# Patient Record
Sex: Male | Born: 1975 | Race: White | Hispanic: No | Marital: Married | Smoking: Current every day smoker
Health system: Southern US, Community
[De-identification: ages and names within clinical notes are randomized; demographics above are authoritative.]

## PROBLEM LIST (undated history)

## (undated) DIAGNOSIS — E7800 Pure hypercholesterolemia, unspecified: Secondary | ICD-10-CM

## (undated) DIAGNOSIS — G5603 Carpal tunnel syndrome, bilateral upper limbs: Secondary | ICD-10-CM

## (undated) DIAGNOSIS — R0681 Apnea, not elsewhere classified: Secondary | ICD-10-CM

## (undated) DIAGNOSIS — F1210 Cannabis abuse, uncomplicated: Secondary | ICD-10-CM

## (undated) DIAGNOSIS — F1110 Opioid abuse, uncomplicated: Secondary | ICD-10-CM

## (undated) DIAGNOSIS — R413 Other amnesia: Secondary | ICD-10-CM

## (undated) DIAGNOSIS — F1010 Alcohol abuse, uncomplicated: Secondary | ICD-10-CM

## (undated) DIAGNOSIS — F319 Bipolar disorder, unspecified: Secondary | ICD-10-CM

## (undated) DIAGNOSIS — I509 Heart failure, unspecified: Secondary | ICD-10-CM

## (undated) DIAGNOSIS — F1310 Sedative, hypnotic or anxiolytic abuse, uncomplicated: Secondary | ICD-10-CM

## (undated) DIAGNOSIS — I10 Essential (primary) hypertension: Secondary | ICD-10-CM

## (undated) DIAGNOSIS — J449 Chronic obstructive pulmonary disease, unspecified: Secondary | ICD-10-CM

## (undated) DIAGNOSIS — J984 Other disorders of lung: Secondary | ICD-10-CM

## (undated) DIAGNOSIS — S0990XA Unspecified injury of head, initial encounter: Secondary | ICD-10-CM

## (undated) HISTORY — PX: CARDIAC CATHETERIZATION: SHX172

## (undated) HISTORY — DX: Sedative, hypnotic or anxiolytic abuse, uncomplicated: F13.10

## (undated) HISTORY — DX: Cannabis abuse, uncomplicated: F12.10

## (undated) HISTORY — DX: Carpal tunnel syndrome, bilateral upper limbs: G56.03

## (undated) HISTORY — PX: ROOT CANAL: SHX2363

## (undated) HISTORY — PX: DENTAL SURGERY: SHX609

## (undated) HISTORY — DX: Alcohol abuse, uncomplicated: F10.10

---

## 2000-05-21 ENCOUNTER — Emergency Department (HOSPITAL_COMMUNITY): Admission: EM | Admit: 2000-05-21 | Discharge: 2000-05-21 | Payer: Self-pay | Admitting: Unknown Physician Specialty

## 2000-05-25 ENCOUNTER — Emergency Department (HOSPITAL_COMMUNITY): Admission: EM | Admit: 2000-05-25 | Discharge: 2000-05-25 | Payer: Self-pay | Admitting: Internal Medicine

## 2000-05-28 ENCOUNTER — Emergency Department (HOSPITAL_COMMUNITY): Admission: EM | Admit: 2000-05-28 | Discharge: 2000-05-28 | Payer: Self-pay | Admitting: Emergency Medicine

## 2000-06-20 ENCOUNTER — Emergency Department (HOSPITAL_COMMUNITY): Admission: EM | Admit: 2000-06-20 | Discharge: 2000-06-20 | Payer: Self-pay | Admitting: Emergency Medicine

## 2000-06-28 ENCOUNTER — Emergency Department (HOSPITAL_COMMUNITY): Admission: EM | Admit: 2000-06-28 | Discharge: 2000-06-28 | Payer: Self-pay | Admitting: *Deleted

## 2002-02-16 ENCOUNTER — Emergency Department (HOSPITAL_COMMUNITY): Admission: EM | Admit: 2002-02-16 | Discharge: 2002-02-16 | Payer: Self-pay | Admitting: Emergency Medicine

## 2002-02-17 ENCOUNTER — Encounter: Payer: Self-pay | Admitting: Emergency Medicine

## 2003-12-07 ENCOUNTER — Emergency Department (HOSPITAL_COMMUNITY): Admission: EM | Admit: 2003-12-07 | Discharge: 2003-12-07 | Payer: Self-pay | Admitting: Emergency Medicine

## 2004-03-21 ENCOUNTER — Ambulatory Visit: Payer: Self-pay | Admitting: Internal Medicine

## 2004-03-21 ENCOUNTER — Ambulatory Visit (HOSPITAL_BASED_OUTPATIENT_CLINIC_OR_DEPARTMENT_OTHER): Admission: RE | Admit: 2004-03-21 | Discharge: 2004-03-21 | Payer: Self-pay | Admitting: Emergency Medicine

## 2005-09-02 ENCOUNTER — Emergency Department (HOSPITAL_COMMUNITY): Admission: EM | Admit: 2005-09-02 | Discharge: 2005-09-02 | Payer: Self-pay | Admitting: Emergency Medicine

## 2006-04-29 ENCOUNTER — Emergency Department (HOSPITAL_COMMUNITY): Admission: EM | Admit: 2006-04-29 | Discharge: 2006-04-29 | Payer: Self-pay | Admitting: Emergency Medicine

## 2006-11-30 ENCOUNTER — Emergency Department (HOSPITAL_COMMUNITY): Admission: EM | Admit: 2006-11-30 | Discharge: 2006-12-01 | Payer: Self-pay | Admitting: Emergency Medicine

## 2006-12-07 ENCOUNTER — Emergency Department (HOSPITAL_COMMUNITY): Admission: EM | Admit: 2006-12-07 | Discharge: 2006-12-08 | Payer: Self-pay | Admitting: Emergency Medicine

## 2007-03-19 HISTORY — PX: LUNG BIOPSY: SHX232

## 2008-01-18 ENCOUNTER — Emergency Department (HOSPITAL_BASED_OUTPATIENT_CLINIC_OR_DEPARTMENT_OTHER): Admission: EM | Admit: 2008-01-18 | Discharge: 2008-01-18 | Payer: Self-pay | Admitting: Emergency Medicine

## 2008-08-26 ENCOUNTER — Emergency Department (HOSPITAL_BASED_OUTPATIENT_CLINIC_OR_DEPARTMENT_OTHER): Admission: EM | Admit: 2008-08-26 | Discharge: 2008-08-26 | Payer: Self-pay | Admitting: Emergency Medicine

## 2008-08-28 ENCOUNTER — Emergency Department (HOSPITAL_BASED_OUTPATIENT_CLINIC_OR_DEPARTMENT_OTHER): Admission: EM | Admit: 2008-08-28 | Discharge: 2008-08-28 | Payer: Self-pay | Admitting: Emergency Medicine

## 2008-09-26 ENCOUNTER — Emergency Department (HOSPITAL_BASED_OUTPATIENT_CLINIC_OR_DEPARTMENT_OTHER): Admission: EM | Admit: 2008-09-26 | Discharge: 2008-09-26 | Payer: Self-pay | Admitting: Emergency Medicine

## 2008-09-28 ENCOUNTER — Emergency Department (HOSPITAL_BASED_OUTPATIENT_CLINIC_OR_DEPARTMENT_OTHER): Admission: EM | Admit: 2008-09-28 | Discharge: 2008-09-28 | Payer: Self-pay | Admitting: Emergency Medicine

## 2008-09-28 ENCOUNTER — Ambulatory Visit: Payer: Self-pay | Admitting: Diagnostic Radiology

## 2008-10-10 ENCOUNTER — Emergency Department (HOSPITAL_BASED_OUTPATIENT_CLINIC_OR_DEPARTMENT_OTHER): Admission: EM | Admit: 2008-10-10 | Discharge: 2008-10-10 | Payer: Self-pay | Admitting: Emergency Medicine

## 2008-10-31 IMAGING — CR DG CHEST 2V
2 series · 2 of 2 positions shown · non-contrast
Comparison: 04/29/06.

CLINICAL DATA: Chest pain.  Sleep apnea.  
 CHEST - 2 VIEW:

[w chest pa]
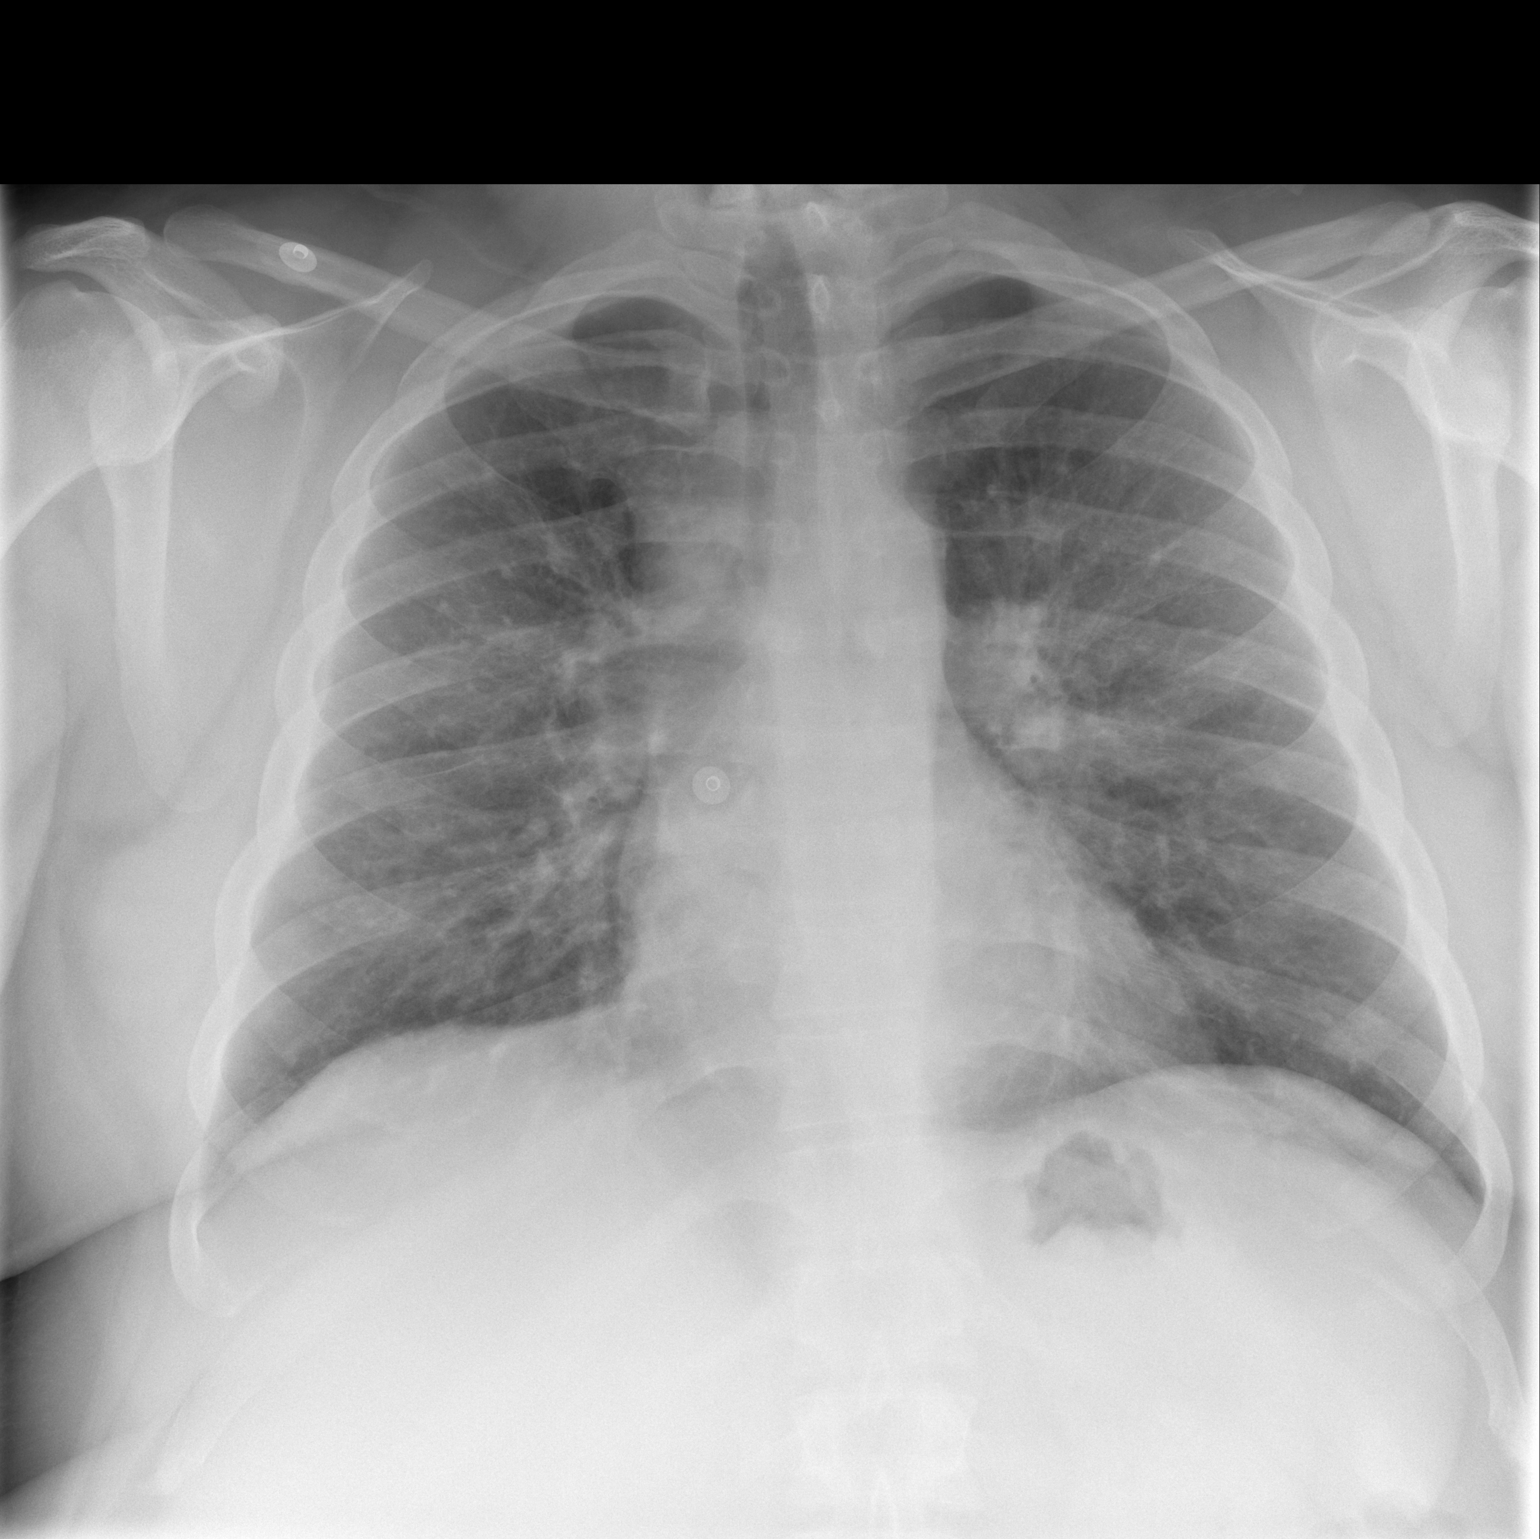

[w chest lat]
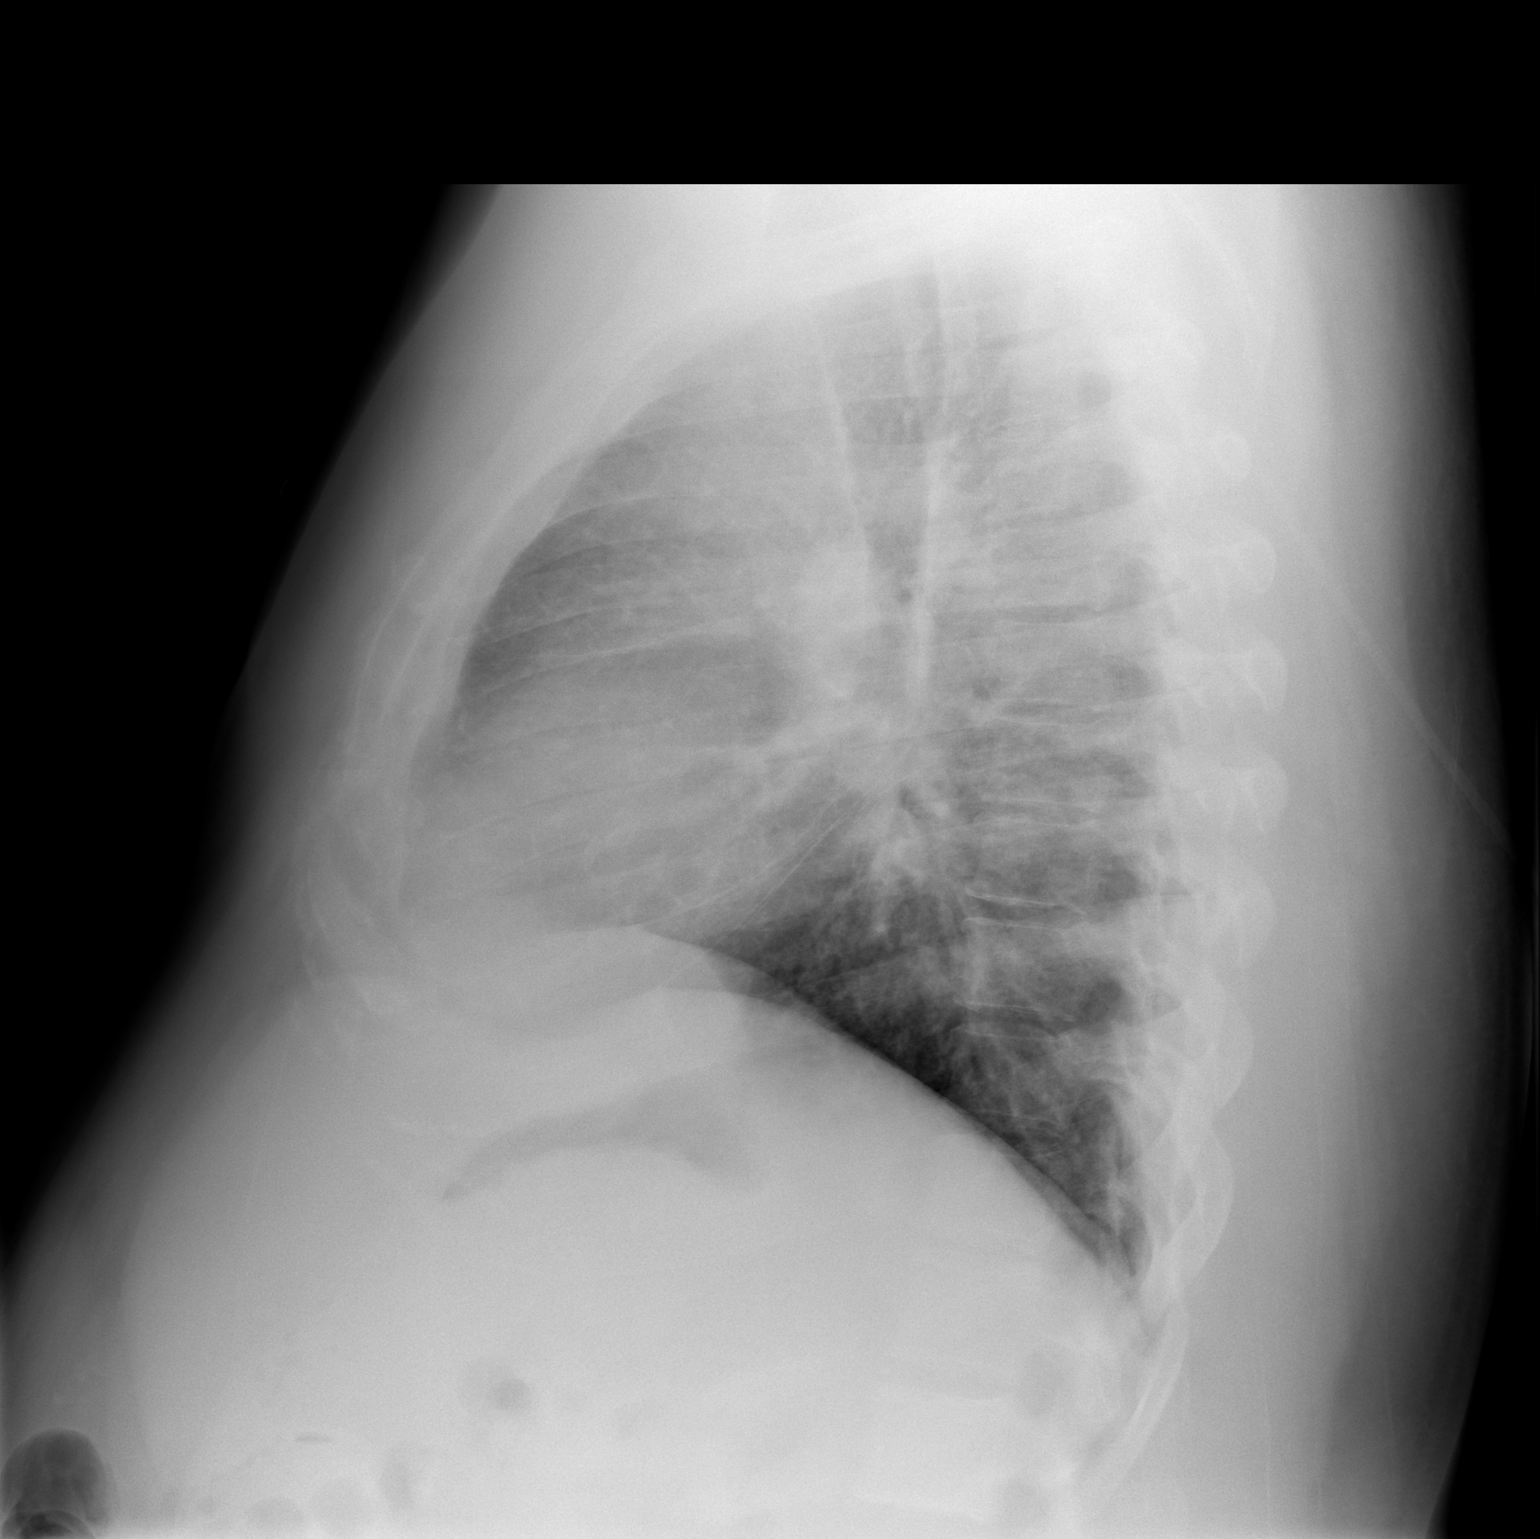

[2 of 2 positions shown; findings below may reference images not displayed]

FINDINGS: Chronic central peribronchial thickening and interstitial prominence is noted.  There is no evidence of acute infiltrate or pleural effusion.  Heart size and mediastinal contours are stable and within normal limits.
IMPRESSION: Stable chest.  No active disease.

## 2008-12-03 ENCOUNTER — Ambulatory Visit: Payer: Self-pay | Admitting: Diagnostic Radiology

## 2008-12-03 ENCOUNTER — Emergency Department (HOSPITAL_BASED_OUTPATIENT_CLINIC_OR_DEPARTMENT_OTHER): Admission: EM | Admit: 2008-12-03 | Discharge: 2008-12-04 | Payer: Self-pay | Admitting: Emergency Medicine

## 2008-12-04 ENCOUNTER — Ambulatory Visit: Payer: Self-pay | Admitting: Diagnostic Radiology

## 2008-12-27 ENCOUNTER — Emergency Department (HOSPITAL_BASED_OUTPATIENT_CLINIC_OR_DEPARTMENT_OTHER): Admission: EM | Admit: 2008-12-27 | Discharge: 2008-12-27 | Payer: Self-pay | Admitting: Emergency Medicine

## 2008-12-27 ENCOUNTER — Ambulatory Visit: Payer: Self-pay | Admitting: Diagnostic Radiology

## 2009-03-18 DIAGNOSIS — G5603 Carpal tunnel syndrome, bilateral upper limbs: Secondary | ICD-10-CM

## 2009-03-18 HISTORY — DX: Carpal tunnel syndrome, bilateral upper limbs: G56.03

## 2009-12-13 ENCOUNTER — Ambulatory Visit: Payer: Self-pay | Admitting: Radiology

## 2009-12-13 ENCOUNTER — Emergency Department (HOSPITAL_BASED_OUTPATIENT_CLINIC_OR_DEPARTMENT_OTHER): Admission: EM | Admit: 2009-12-13 | Discharge: 2009-12-13 | Payer: Self-pay | Admitting: Emergency Medicine

## 2009-12-30 ENCOUNTER — Encounter: Admission: RE | Admit: 2009-12-30 | Discharge: 2009-12-30 | Payer: Self-pay | Admitting: Orthopaedic Surgery

## 2010-04-25 ENCOUNTER — Emergency Department (HOSPITAL_BASED_OUTPATIENT_CLINIC_OR_DEPARTMENT_OTHER)
Admission: EM | Admit: 2010-04-25 | Discharge: 2010-04-25 | Disposition: A | Discharge status: Home / Self Care | Payer: Medicare Other | Attending: Emergency Medicine | Admitting: Emergency Medicine

## 2010-04-25 DIAGNOSIS — F17200 Nicotine dependence, unspecified, uncomplicated: Secondary | ICD-10-CM | POA: Insufficient documentation

## 2010-04-25 DIAGNOSIS — G8929 Other chronic pain: Secondary | ICD-10-CM | POA: Insufficient documentation

## 2010-04-25 DIAGNOSIS — I10 Essential (primary) hypertension: Secondary | ICD-10-CM | POA: Insufficient documentation

## 2010-04-25 DIAGNOSIS — E119 Type 2 diabetes mellitus without complications: Secondary | ICD-10-CM | POA: Insufficient documentation

## 2010-04-25 DIAGNOSIS — M549 Dorsalgia, unspecified: Secondary | ICD-10-CM | POA: Insufficient documentation

## 2010-04-25 DIAGNOSIS — F319 Bipolar disorder, unspecified: Secondary | ICD-10-CM | POA: Insufficient documentation

## 2010-04-25 DIAGNOSIS — J4489 Other specified chronic obstructive pulmonary disease: Secondary | ICD-10-CM | POA: Insufficient documentation

## 2010-04-25 DIAGNOSIS — J449 Chronic obstructive pulmonary disease, unspecified: Secondary | ICD-10-CM | POA: Insufficient documentation

## 2010-04-25 DIAGNOSIS — Z79899 Other long term (current) drug therapy: Secondary | ICD-10-CM | POA: Insufficient documentation

## 2010-06-06 ENCOUNTER — Ambulatory Visit (INDEPENDENT_AMBULATORY_CARE_PROVIDER_SITE_OTHER): Payer: Medicare Other | Admitting: Licensed Clinical Social Worker

## 2010-06-06 DIAGNOSIS — F329 Major depressive disorder, single episode, unspecified: Secondary | ICD-10-CM

## 2010-06-20 ENCOUNTER — Encounter (HOSPITAL_COMMUNITY): Payer: Medicare Other | Admitting: Licensed Clinical Social Worker

## 2010-06-21 LAB — GLUCOSE, CAPILLARY
Glucose-Capillary: 269 mg/dL — ABNORMAL HIGH (ref 70–99)
Glucose-Capillary: 476 mg/dL — ABNORMAL HIGH (ref 70–99)

## 2010-06-21 LAB — POCT I-STAT, CHEM 8
Hemoglobin: 17 g/dL (ref 13.0–17.0)
Sodium: 131 mEq/L — ABNORMAL LOW (ref 135–145)

## 2010-06-21 LAB — POCT TOXICOLOGY PANEL: Tetrahydrocannabinol: POSITIVE

## 2010-06-21 LAB — COMPREHENSIVE METABOLIC PANEL
AST: 40 U/L — ABNORMAL HIGH (ref 0–37)
Alkaline Phosphatase: 111 U/L (ref 39–117)
BUN: 15 mg/dL (ref 6–23)
Chloride: 99 mEq/L (ref 96–112)
GFR calc non Af Amer: >60 mL/min (ref >60)
Potassium: 4.5 mEq/L (ref 3.5–5.1)
Sodium: 135 mEq/L (ref 135–145)
Total Bilirubin: 0.6 mg/dL (ref 0.3–1.2)

## 2010-06-21 LAB — POCT I-STAT 3, ART BLOOD GAS (G3+)
Acid-base deficit: 3 mmol/L — ABNORMAL HIGH (ref 0.0–2.0)
O2 Saturation: 94 %
Patient temperature: 97.5
TCO2: 22 mmol/L (ref 0–100)
pCO2 arterial: 33.7 mmHg — ABNORMAL LOW (ref 35.0–45.0)
pH, Arterial: 7.405 (ref 7.350–7.450)

## 2010-06-21 LAB — URINALYSIS, ROUTINE W REFLEX MICROSCOPIC
Bilirubin Urine: NEGATIVE
Hgb urine dipstick: NEGATIVE

## 2010-06-21 LAB — DIFFERENTIAL
Basophils Absolute: 0.1 10*3/uL (ref 0.0–0.1)
Basophils Relative: 1 % (ref 0–1)
Eosinophils Relative: 2 % (ref 0–5)
Lymphocytes Relative: 32 % (ref 12–46)
Monocytes Absolute: 0.5 10*3/uL (ref 0.1–1.0)
Monocytes Relative: 5 % (ref 3–12)
Neutro Abs: 6.1 10*3/uL (ref 1.7–7.7)
Neutrophils Relative %: 61 % (ref 43–77)

## 2010-06-21 LAB — URINE MICROSCOPIC-ADD ON

## 2010-06-22 LAB — COMPREHENSIVE METABOLIC PANEL
AST: 27 U/L (ref 0–37)
Albumin: 4 g/dL (ref 3.5–5.2)
Calcium: 9.9 mg/dL (ref 8.4–10.5)
Creatinine, Ser: 0.8 mg/dL (ref 0.4–1.5)
GFR calc Af Amer: >60 mL/min (ref >60)
GFR calc non Af Amer: >60 mL/min (ref >60)
Total Protein: 7.6 g/dL (ref 6.0–8.3)

## 2010-06-22 LAB — CBC
HCT: 45.9 % (ref 39.0–52.0)
Hemoglobin: 16 g/dL (ref 13.0–17.0)
MCHC: 34.9 g/dL (ref 30.0–36.0)
MCV: 87.5 fL (ref 78.0–100.0)
Platelets: 246 10*3/uL (ref 150–400)
RBC: 5.25 MIL/uL (ref 4.22–5.81)
RDW: 12.3 % (ref 11.5–15.5)
WBC: 9.2 10*3/uL (ref 4.0–10.5)

## 2010-06-22 LAB — URINALYSIS, ROUTINE W REFLEX MICROSCOPIC
Bilirubin Urine: NEGATIVE
Hgb urine dipstick: NEGATIVE
Leukocytes, UA: NEGATIVE
Protein, ur: NEGATIVE mg/dL
Specific Gravity, Urine: 1.03 (ref 1.005–1.030)
Urobilinogen, UA: 0.2 mg/dL (ref 0.0–1.0)
pH: 6 (ref 5.0–8.0)

## 2010-06-22 LAB — DIFFERENTIAL
Basophils Absolute: 0.1 10*3/uL (ref 0.0–0.1)
Eosinophils Absolute: 0.1 10*3/uL (ref 0.0–0.7)
Eosinophils Relative: 1 % (ref 0–5)
Neutrophils Relative %: 69 % (ref 43–77)

## 2010-06-22 LAB — GLUCOSE, CAPILLARY
Glucose-Capillary: 278 mg/dL — ABNORMAL HIGH (ref 70–99)
Glucose-Capillary: >600 mg/dL (ref 70–99)

## 2010-06-22 LAB — URINE CULTURE

## 2010-06-22 LAB — POCT TOXICOLOGY PANEL

## 2010-06-22 LAB — ETHANOL: Alcohol, Ethyl (B): <5 mg/dL (ref 0–10)

## 2010-06-22 LAB — URINE MICROSCOPIC-ADD ON

## 2010-06-22 LAB — PROTIME-INR: Prothrombin Time: 12.2 seconds (ref 11.6–15.2)

## 2010-08-03 NOTE — Procedures (Signed)
NAME:  ESTILL, LLERENA NO.:  [id number]   MEDICAL RECORD NO.:  [id number]          PATIENT TYPE:  OUT   LOCATION:  SLEEP CENTER                 FACILITY:  [facility]   PHYSICIAN:  Clinton D. Maple Hudson, M.D. DATE OF BIRTH:  June 21, 1975   DATE OF STUDY:                              NOCTURNAL POLYSOMNOGRAM   REFERRING PHYSICIAN:  Brett Canales A. Cleta Alberts, M.D.   INDICATION FOR STUDY AND HISTORY:  Hypersomnia with sleep apnea.   Epworth sleepiness score 20/24, BMI 42.3, weight 264 pounds.   SLEEP ARCHITECTURE:  Total sleep time 447 minutes with sleep efficiency of  95%.  Stage I was 3%; stage II, 67%; Stages III and IV 4%.  REM was 26% of  total sleep time.  Latency to sleep onset 6 minutes, latency to REM 95  minutes.  Awake after sleep onset 17 minutes.  Arousal index 44.  No sleep  medications were taken.   RESPIRATORY DATA:  Split study protocol.  Respiratory disturbance index  (RDI) 120.5 obstructive events per hour indicating very severe obstructive  sleep apnea/hypopnea syndrome before CPAP.  This included 188 obstructive  apneas, 1 central apnea, 37 mixed apneas, and 35 hypopneas before CPAP.  She  slept only supine.  REM RDI 25.3 per hour.  CPAP was then titrated to 13 CWP  with only fair control, RDI 16.9.  Subsequently, the technician switched to  BIPAP with BiPAP inspiratory pressure of 20 CWP/expiratory pressure 14 CWP.  RDI was 0 per hour, and snoring was prevented.  The patient seemed to  tolerate BiPAP better than CPAP.  A medium Respironics Comfort full-face  mask was used with heated humidifier.   OXYGEN DATA:  Very loud snoring with oxygen desaturation to a nadir of 50%  before CPAP.  After CPAP titration, oxygen saturation was held around 90 to  95% on room air.   CARDIAC DATA:  Sinus arrhythmia reflecting vagal stimulation during apnea  events.  Rare PVCs.   MOVEMENT AND PARASOMNIA:  A total of 94 limb jerks were recorded of which 13  were associated with  arousal or awakening for a periodic limb movement with  arousal index of 1.7 per hour which is not likely to be significant.   IMPRESSION AND RECOMMENDATIONS:  1.  Very severe obstructive sleep apnea/hypopnea syndrome, RDI 120.5 per      hour with severe oxygen desaturation to a nadir of 50%.  2.  Successful control using BiPAP inspiratory 20 CWP/expiratory 14 CWP wit      a medium Respironics Comfort full-face mask and heated humidifier.  3.  Mild cardiac sinus arrhythmia and very mild periodic limb movement are      likely to improve as the apnea is corrected.   Clinton D. Maple Hudson, M.D.  Diplomate, Biomedical engineer of Sleep Medicine                                                           Clinton D. Maple Hudson, M.D.  Diplomate, [employer] Board   CDY/MEDQ  D:  03/25/2004 10:48:14  T:  03/25/2004 14:38:27  Job:  [id number]   cc:   Brett Canales A. Cleta Alberts, M.D.  [address]  [place]  [place] [address]  Fax: [phone]

## 2010-08-27 ENCOUNTER — Ambulatory Visit (INDEPENDENT_AMBULATORY_CARE_PROVIDER_SITE_OTHER): Payer: Medicare Other | Admitting: Physician Assistant

## 2010-08-27 DIAGNOSIS — F6381 Intermittent explosive disorder: Secondary | ICD-10-CM

## 2010-08-27 DIAGNOSIS — F39 Unspecified mood [affective] disorder: Secondary | ICD-10-CM

## 2010-09-17 ENCOUNTER — Encounter (INDEPENDENT_AMBULATORY_CARE_PROVIDER_SITE_OTHER): Payer: Medicare Other | Admitting: Physician Assistant

## 2010-09-17 DIAGNOSIS — F309 Manic episode, unspecified: Secondary | ICD-10-CM

## 2010-09-17 DIAGNOSIS — F6381 Intermittent explosive disorder: Secondary | ICD-10-CM

## 2010-09-26 ENCOUNTER — Encounter (HOSPITAL_COMMUNITY): Payer: Medicare Other | Admitting: Physician Assistant

## 2010-10-01 ENCOUNTER — Encounter (HOSPITAL_COMMUNITY): Payer: Medicare Other | Admitting: Physician Assistant

## 2010-10-02 ENCOUNTER — Ambulatory Visit (HOSPITAL_COMMUNITY): Payer: Medicare Other | Admitting: Licensed Clinical Social Worker

## 2010-10-11 ENCOUNTER — Ambulatory Visit (HOSPITAL_COMMUNITY): Payer: Medicare Other | Admitting: Psychiatry

## 2010-10-23 ENCOUNTER — Ambulatory Visit (INDEPENDENT_AMBULATORY_CARE_PROVIDER_SITE_OTHER): Payer: Medicare Other | Admitting: Licensed Clinical Social Worker

## 2010-10-23 DIAGNOSIS — F331 Major depressive disorder, recurrent, moderate: Secondary | ICD-10-CM

## 2010-11-03 IMAGING — CR DG CHEST 1V PORT
1 series · 1 of 1 positions shown · non-contrast
Comparison: Chest CTs and radiographs 12/08/2006 and earlier.

CLINICAL DATA: 33-year-old male status post fall.  Shortness of
breath.

PORTABLE CHEST - 1 VIEW

[view not recorded]
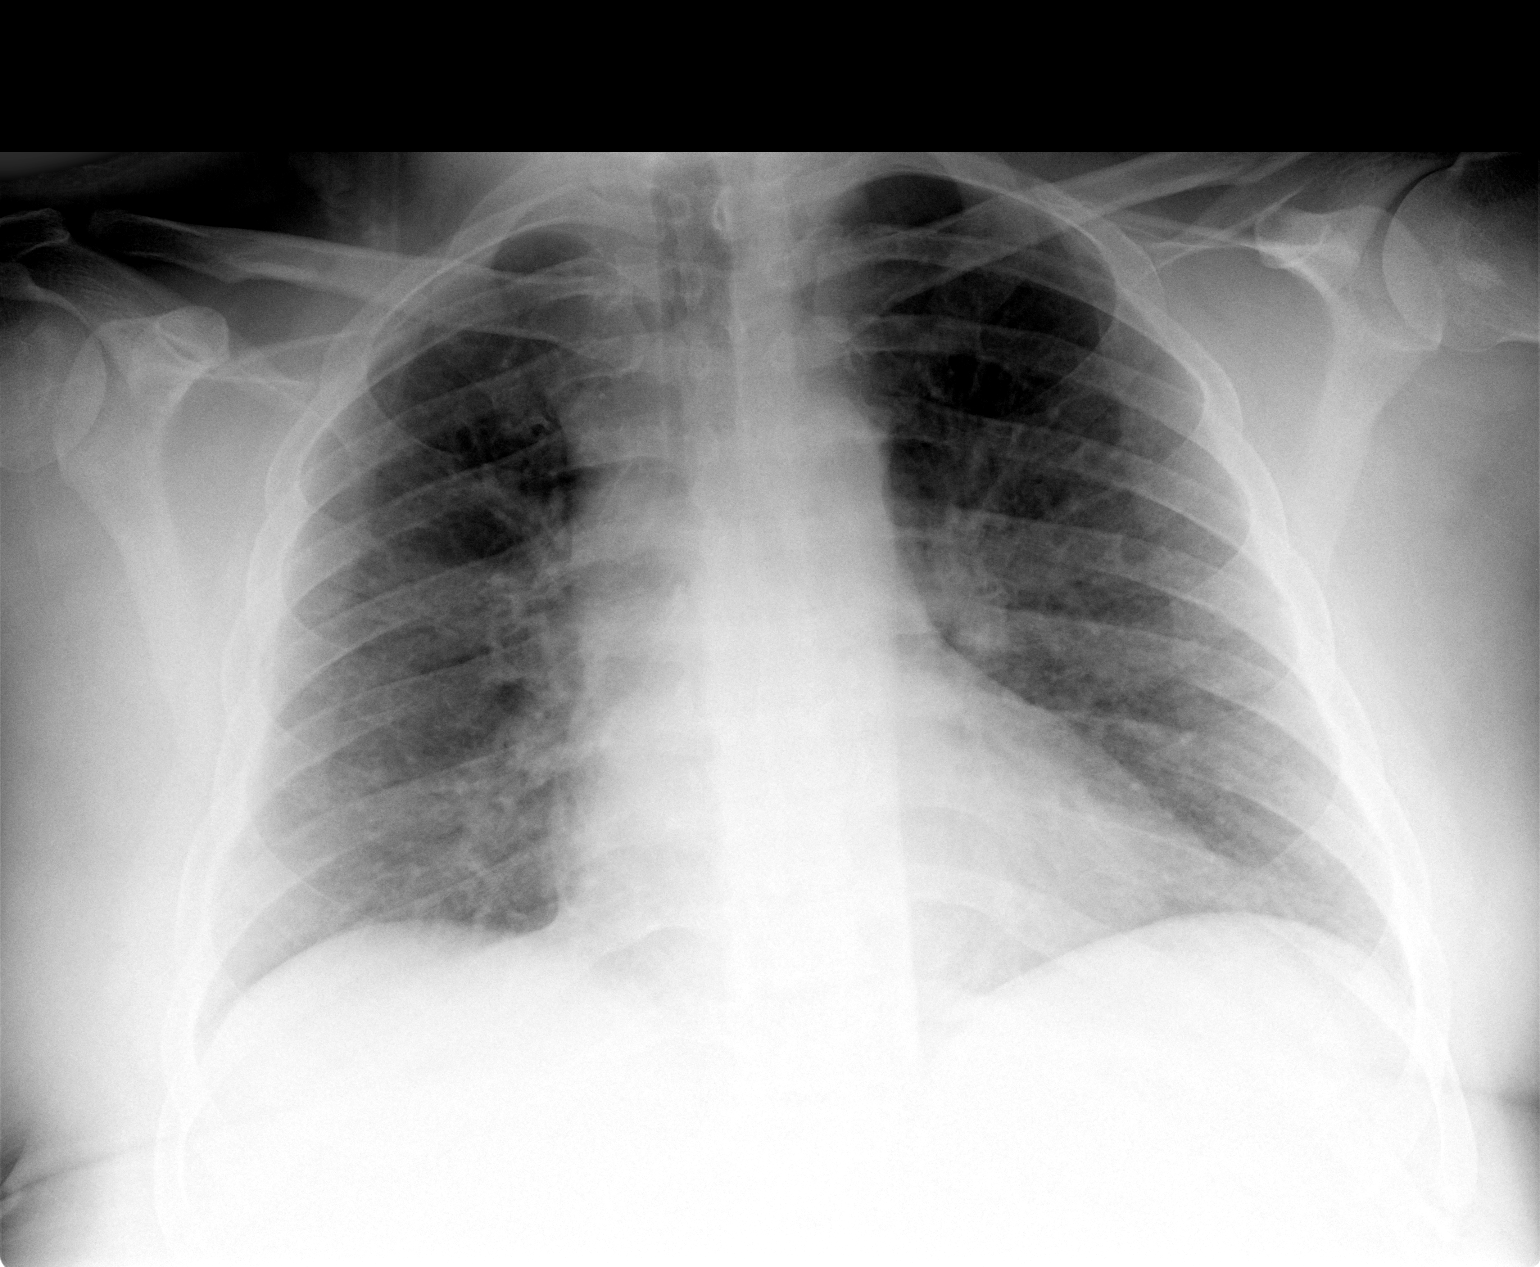

[1 of 1 positions shown; findings below may reference images not displayed]

FINDINGS: AP portable semi upright view at 0459 hours.  Stable lung
volumes.  Stable cardiac size and mediastinal contours.  No
pneumothorax, pleural effusion, or pulmonary contusion.  Interval
suspected clearing of the indistinct peripheral pulmonary opacities
seen on the prior radiographs.  No acute airspace disease.  There
is residual diffuse increased interstitial opacity.
IMPRESSION: No acute cardiopulmonary abnormality.  Chronic increased
interstitial opacity, chronic lung disease suspected.

## 2010-11-05 ENCOUNTER — Encounter (INDEPENDENT_AMBULATORY_CARE_PROVIDER_SITE_OTHER): Payer: Medicare Other | Admitting: Physician Assistant

## 2010-11-05 DIAGNOSIS — F6381 Intermittent explosive disorder: Secondary | ICD-10-CM

## 2010-11-23 ENCOUNTER — Encounter (HOSPITAL_COMMUNITY): Payer: Medicare Other | Admitting: Licensed Clinical Social Worker

## 2010-12-12 ENCOUNTER — Encounter (INDEPENDENT_AMBULATORY_CARE_PROVIDER_SITE_OTHER): Payer: Medicare Other | Admitting: Physician Assistant

## 2010-12-12 DIAGNOSIS — F6381 Intermittent explosive disorder: Secondary | ICD-10-CM

## 2010-12-19 LAB — BASIC METABOLIC PANEL
CO2: 26
Calcium: 9.7
Creatinine, Ser: 0.7
GFR calc Af Amer: >60
Glucose, Bld: 112 — ABNORMAL HIGH

## 2010-12-19 LAB — URINALYSIS, ROUTINE W REFLEX MICROSCOPIC
Nitrite: NEGATIVE
Specific Gravity, Urine: 1.031 — ABNORMAL HIGH
Urobilinogen, UA: 1

## 2010-12-19 LAB — GLUCOSE, CAPILLARY: Glucose-Capillary: 107 — ABNORMAL HIGH

## 2010-12-19 LAB — URINE MICROSCOPIC-ADD ON

## 2010-12-19 LAB — URINE CULTURE

## 2010-12-21 ENCOUNTER — Emergency Department (INDEPENDENT_AMBULATORY_CARE_PROVIDER_SITE_OTHER): Payer: Medicare Other

## 2010-12-21 ENCOUNTER — Emergency Department (HOSPITAL_BASED_OUTPATIENT_CLINIC_OR_DEPARTMENT_OTHER)
Admission: EM | Admit: 2010-12-21 | Discharge: 2010-12-21 | Disposition: A | Discharge status: Home / Self Care | Payer: Medicare Other | Attending: Emergency Medicine | Admitting: Emergency Medicine

## 2010-12-21 DIAGNOSIS — J4489 Other specified chronic obstructive pulmonary disease: Secondary | ICD-10-CM | POA: Insufficient documentation

## 2010-12-21 DIAGNOSIS — M7730 Calcaneal spur, unspecified foot: Secondary | ICD-10-CM

## 2010-12-21 DIAGNOSIS — L02414 Cutaneous abscess of left upper limb: Secondary | ICD-10-CM

## 2010-12-21 DIAGNOSIS — IMO0002 Reserved for concepts with insufficient information to code with codable children: Secondary | ICD-10-CM | POA: Insufficient documentation

## 2010-12-21 DIAGNOSIS — I10 Essential (primary) hypertension: Secondary | ICD-10-CM | POA: Insufficient documentation

## 2010-12-21 DIAGNOSIS — S9031XA Contusion of right foot, initial encounter: Secondary | ICD-10-CM

## 2010-12-21 DIAGNOSIS — J449 Chronic obstructive pulmonary disease, unspecified: Secondary | ICD-10-CM | POA: Insufficient documentation

## 2010-12-21 DIAGNOSIS — F17200 Nicotine dependence, unspecified, uncomplicated: Secondary | ICD-10-CM | POA: Insufficient documentation

## 2010-12-21 DIAGNOSIS — G4730 Sleep apnea, unspecified: Secondary | ICD-10-CM | POA: Insufficient documentation

## 2010-12-21 DIAGNOSIS — S9030XA Contusion of unspecified foot, initial encounter: Secondary | ICD-10-CM | POA: Insufficient documentation

## 2010-12-21 DIAGNOSIS — W208XXA Other cause of strike by thrown, projected or falling object, initial encounter: Secondary | ICD-10-CM | POA: Insufficient documentation

## 2010-12-21 DIAGNOSIS — E119 Type 2 diabetes mellitus without complications: Secondary | ICD-10-CM | POA: Insufficient documentation

## 2010-12-21 HISTORY — DX: Pure hypercholesterolemia, unspecified: E78.00

## 2010-12-21 HISTORY — DX: Other disorders of lung: J98.4

## 2010-12-21 HISTORY — DX: Apnea, not elsewhere classified: R06.81

## 2010-12-21 HISTORY — DX: Essential (primary) hypertension: I10

## 2010-12-21 HISTORY — DX: Chronic obstructive pulmonary disease, unspecified: J44.9

## 2010-12-21 MED ORDER — OXYCODONE-ACETAMINOPHEN 5-325 MG PO TABS
2.0000 | ORAL_TABLET | Freq: Once | ORAL | Status: AC
  Administered 2010-12-21: 2 via ORAL
  Filled 2010-12-21: qty 2

## 2010-12-21 MED ORDER — LIDOCAINE-EPINEPHRINE 2 %-1:100000 IJ SOLN
20.0000 mL | Freq: Once | INTRAMUSCULAR | Status: AC
  Administered 2010-12-21: 1 mL
  Filled 2010-12-21: qty 1

## 2010-12-21 MED ORDER — IBUPROFEN 400 MG PO TABS
400.0000 mg | ORAL_TABLET | Freq: Once | ORAL | Status: AC
  Administered 2010-12-21: 400 mg via ORAL
  Filled 2010-12-21: qty 1

## 2010-12-21 NOTE — ED Notes (Signed)
Also c/o abscess to left arm x 2 weeks

## 2010-12-21 NOTE — ED Notes (Signed)
Fight foot injury yesterday-states hx of previous fall/injury to same foot

## 2010-12-21 NOTE — ED Provider Notes (Signed)
History   35yM with abscess L arm. Worsening over past few days. Painful to touch. No drainage. No fever or chills. No n/v. Hx of diabetes. Also c/o R foot pain. Dropped something on it weeks ago and had been having persistent pain since. Can ambulate. No numbness, tingling or loss of strength. Foot feels "fat."   CSN: [id number] Arrival date & time: 12/21/2010  3:37 PM  Chief Complaint  Patient presents with  . Foot Injury  . Abscess    (Consider location/radiation/quality/duration/timing/severity/associated sxs/prior treatment) HPI  Past Medical History  Diagnosis Date  . Diabetes mellitus   . Apnea   . Hypertension   . High cholesterol   . Lung disease   . COPD (chronic obstructive pulmonary disease)     History reviewed. No pertinent past surgical history.  No family history on file.  History  Substance Use Topics  . Smoking status: Current Everyday Smoker  . Smokeless tobacco: Not on file  . Alcohol Use: No      Review of Systems  Constitutional: Negative for fever, chills and fatigue.  HENT: Negative.   Cardiovascular: Negative.   Gastrointestinal: Negative.   Musculoskeletal:       R foot pain  Skin:       Abscess L arm  Neurological: Negative.   All other systems reviewed and are negative.    Allergies  Darvocet and Darvon  Home Medications  No current outpatient prescriptions on file.  BP 142/82  Pulse 100  Temp(Src) 98.1 F (36.7 C) (Oral)  Resp 20  Ht 5\' 6"  (1.676 m)  Wt 250 lb 5 oz (113.541 kg)  BMI 40.40 kg/m2  SpO2 97%  Physical Exam  Nursing note reviewed. Constitutional: He appears well-developed and well-nourished.  HENT:  Head: Normocephalic and atraumatic.  Eyes: Conjunctivae are normal.  Cardiovascular: Normal rate and regular rhythm.   Pulmonary/Chest: Effort normal and breath sounds normal. No respiratory distress.  Musculoskeletal:       Mild diffuse swelling R foot. Tenderness along area of mid first metatarsal. Skin  intact. No lesions. FROM. Neurovascularly intact distally.  Skin: Skin is warm and dry.       2cm erythematous and fluctuant lesion medial aspect mid L upper arm. Consistent with abscess. No drainage.. No significant surrounding cellulitic changes. Neuro vascularly intact distally. No mass palpated in axilla.    ED Course  INCISION AND DRAINAGE Date/Time: 12/21/2010 5:34 PM Performed by: Raeford Razor Authorized by: Raeford Razor Consent: Verbal consent obtained. Consent given by: patient Patient understanding: patient states understanding of the procedure being performed Patient identity confirmed: arm band Type: abscess Body area: upper extremity Location details: left arm Anesthesia: local infiltration Local anesthetic: lidocaine 2% with epinephrine Anesthetic total: 3 ml Patient sedated: no Scalpel size: 11 Incision type: single straight Complexity: simple Drainage: purulent Drainage amount: moderate Wound treatment: wound left open Packing material: 1/4 in iodoform gauze Patient tolerance: Patient tolerated the procedure well with no immediate complications.   (including critical care time)  Labs Reviewed - No data to display Dg Foot Complete Right  12/21/2010  *RADIOLOGY REPORT*  Clinical Data: Injury  RIGHT FOOT COMPLETE - 3+ VIEW  Comparison: None.  Findings: Three views of the right foot submitted.  No acute fracture or subluxation.  Small posterior spur of the calcaneus.  IMPRESSION: No acute fracture or subluxation.  Small posterior spur of the calcaneus.  Original Report Authenticated By: Natasha Mead, M.D.     No diagnosis found.  MDM  35yM with abscess L arm. Plan I&D. Hx of diabetes, but afebrile and no significant surrounding cellulitis. Will defer from abx at this time. XR foot neg for fx. Plan aymptomatic tx.        Raeford Razor, MD 12/21/10 [phone]

## 2010-12-21 NOTE — ED Notes (Signed)
Pt in xray

## 2010-12-28 LAB — POCT I-STAT CREATININE
Creatinine, Ser: 0.8
Operator id: 133351

## 2010-12-28 LAB — I-STAT 8, (EC8 V) (CONVERTED LAB)
Acid-Base Excess: 2
Chloride: 105
Glucose, Bld: 141 — ABNORMAL HIGH
pCO2, Ven: 40 — ABNORMAL LOW
pH, Ven: 7.433 — ABNORMAL HIGH

## 2010-12-28 LAB — POCT CARDIAC MARKERS
CKMB, poc: <1 — ABNORMAL LOW
Myoglobin, poc: 54.9

## 2010-12-28 LAB — URINALYSIS, ROUTINE W REFLEX MICROSCOPIC
Bilirubin Urine: NEGATIVE
Hgb urine dipstick: NEGATIVE
Nitrite: NEGATIVE
Specific Gravity, Urine: 1.02
pH: 7.5

## 2011-01-09 ENCOUNTER — Encounter (INDEPENDENT_AMBULATORY_CARE_PROVIDER_SITE_OTHER): Payer: Medicare Other | Admitting: Psychiatry

## 2011-01-09 DIAGNOSIS — F3130 Bipolar disorder, current episode depressed, mild or moderate severity, unspecified: Secondary | ICD-10-CM

## 2011-01-21 ENCOUNTER — Encounter (HOSPITAL_COMMUNITY): Payer: Self-pay | Admitting: Psychology

## 2011-02-06 ENCOUNTER — Encounter (HOSPITAL_COMMUNITY): Payer: Self-pay | Admitting: Psychiatry

## 2011-02-06 ENCOUNTER — Ambulatory Visit (INDEPENDENT_AMBULATORY_CARE_PROVIDER_SITE_OTHER): Payer: Medicare Other | Admitting: Psychiatry

## 2011-02-06 DIAGNOSIS — F3181 Bipolar II disorder: Secondary | ICD-10-CM

## 2011-02-06 DIAGNOSIS — F3189 Other bipolar disorder: Secondary | ICD-10-CM

## 2011-02-06 DIAGNOSIS — F32A Depression, unspecified: Secondary | ICD-10-CM

## 2011-02-06 DIAGNOSIS — F329 Major depressive disorder, single episode, unspecified: Secondary | ICD-10-CM

## 2011-02-06 MED ORDER — SERTRALINE HCL 50 MG PO TABS: 50.0000 mg | ORAL_TABLET | Freq: Every day | ORAL | Status: DC

## 2011-02-06 MED ORDER — TOPIRAMATE 25 MG PO TABS: 25.0000 mg | ORAL_TABLET | Freq: Two times a day (BID) | ORAL | Status: DC

## 2011-02-06 NOTE — Patient Instructions (Addendum)
Discussion of Zoloft and Topamax  Risks, Benefits and Alternative Treatments  are discussed and pt agrees.  He says he cannot get them until later in Dec.  He agres to call if he has any Side. Effects.  Recall  It is important to report any suicidal thoughts to call office or call the Crisis numbers you have been given Return in 2 months   Recall  It is important to report any suicidal thoughts to office or call the Crisis numbers you have been given

## 2011-02-06 NOTE — Progress Notes (Signed)
Carlos Porter, known as 'Carlos Porter' is 35 yo.  Diabetes, complaint of pinched nerve, and carpal tunnel syndrome with lung disease of unknown etiology is comorbid with Bipolar II irritability and depression, rapid cycling.  He has been to [facility] and worked with [facility]. Counseling.  He expresses anger that he has no pain medication.  He tried to seek Xanax but is informed that there are medications far more effective.  He did not take the SGA saying it made him aggressive and irritable.  He  Feared calling the office  He said he was afraid he would be sent to inpatient care., laughing about it.  He was assured that's not the case for most medication adverse reactions.  He says he does not care about his medication for diabetes and does not take Metformin or insulin.  Consequences are discussed  He seems quite limited in processing his treatment plan

## 2011-04-08 ENCOUNTER — Ambulatory Visit (HOSPITAL_COMMUNITY): Payer: Medicare Other | Admitting: Psychiatry

## 2011-05-01 ENCOUNTER — Ambulatory Visit (HOSPITAL_COMMUNITY): Payer: Medicare Other | Admitting: Licensed Clinical Social Worker

## 2011-05-02 ENCOUNTER — Ambulatory Visit (HOSPITAL_COMMUNITY): Payer: Medicare Other | Admitting: Psychiatry

## 2011-05-03 ENCOUNTER — Ambulatory Visit (HOSPITAL_COMMUNITY): Payer: Medicare Other | Admitting: Licensed Clinical Social Worker

## 2011-05-09 ENCOUNTER — Encounter (HOSPITAL_COMMUNITY): Payer: Self-pay | Admitting: Licensed Clinical Social Worker

## 2011-05-09 ENCOUNTER — Ambulatory Visit (INDEPENDENT_AMBULATORY_CARE_PROVIDER_SITE_OTHER): Payer: Medicare Other | Admitting: Licensed Clinical Social Worker

## 2011-05-09 DIAGNOSIS — F329 Major depressive disorder, single episode, unspecified: Secondary | ICD-10-CM

## 2011-05-09 DIAGNOSIS — IMO0001 Reserved for inherently not codable concepts without codable children: Secondary | ICD-10-CM

## 2011-05-09 DIAGNOSIS — F3189 Other bipolar disorder: Secondary | ICD-10-CM

## 2011-05-09 DIAGNOSIS — F3181 Bipolar II disorder: Secondary | ICD-10-CM | POA: Insufficient documentation
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
## 2011-05-10 ENCOUNTER — Encounter (HOSPITAL_COMMUNITY): Payer: Self-pay | Admitting: Psychiatry

## 2011-05-10 ENCOUNTER — Ambulatory Visit (INDEPENDENT_AMBULATORY_CARE_PROVIDER_SITE_OTHER): Payer: Medicare Other | Admitting: Psychiatry

## 2011-05-10 VITALS — BP 129/85 | HR 90 | Ht 66.0 in | Wt 241.0 lb

## 2011-05-10 DIAGNOSIS — F32A Depression, unspecified: Secondary | ICD-10-CM

## 2011-05-10 DIAGNOSIS — F3189 Other bipolar disorder: Secondary | ICD-10-CM

## 2011-05-10 DIAGNOSIS — F329 Major depressive disorder, single episode, unspecified: Secondary | ICD-10-CM

## 2011-05-10 DIAGNOSIS — F3181 Bipolar II disorder: Secondary | ICD-10-CM

## 2011-05-10 MED ORDER — SERTRALINE HCL 50 MG PO TABS: 100.0000 mg | ORAL_TABLET | Freq: Every day | ORAL | Status: DC

## 2011-05-10 MED ORDER — TRAZODONE HCL 150 MG PO TABS: 150.0000 mg | ORAL_TABLET | Freq: Every day | ORAL | Status: AC

## 2011-05-10 MED ORDER — TOPIRAMATE 25 MG PO TABS: 50.0000 mg | ORAL_TABLET | Freq: Two times a day (BID) | ORAL | Status: DC

## 2011-05-10 NOTE — Patient Instructions (Signed)
You have been given D. medication you have been taking Topamax, temperature remained with an increased dose of 50 mg twice a day. Zoloft has been increased to 100 mg one tab a day. You have expressed great anxiety today and you have been given trazodone 150 mg to help you sleep. You have reported this has been very effective on a trial basis. Remember if any of these medications cause adverse effects please call the office [phone] and make an urgent appointment. He reported that you were feeling very suicidal because of social situations and you were nearly committed. Remember very important for you to be honest about your emotions and if you aren't killing not suicidal we want you to go to the emergency department because they can keep it there until you feel calm enough again to go home it doesn't necessarily mean that she would be admitted to the psychiatric unit so trusts that you're going to tell somebody and get help 911 is the most immediate response the emergency room and also remembered [facility] [phone] is open 24 7

## 2011-05-10 NOTE — Progress Notes (Signed)
Patient ID: Carlos Porter, male   DOB: 18-Jul-1975, 36 y.o.   MRN: [id number] Carlos Porter enters the office appearing very distressed. He states that he was very suicidal and told his medical doctor that he was. He said that he was almost committed but not to. He came in to explain that his wife has had a stroke and she was in a rehabilitation area. He said that he was playing with somebody in the yard and they were joking about being  retarded. Somehow the comment was told to his wife and the staff apparently misinterpreted what was going on. By his explanation they accused him of calling his wife a retarded and have banned him from any further visits. Her parents are angry with him and they will not talk with him to report how should she is doing. He is distraught because he wants to see his wife and be able to visit her. He states that his therapist Dorma Russell is going to make a call to try and clarify the situation.  Since that episode he has been unable to sleep and is in great state of anxiety. Today, the medications he has been taking her increase to the next level. The Topamax was increased to 50 mg twice a day (100 mg daily) and Zoloft was increased to 100 mg daily. The combination of these should decrease a lot of his anxiety and help him control his mood swings. He is also given trazodone 150 mg bedtime to improve his ability to sleep through the night. He agrees to call if he has any S E. He wants to come back in 2 weeks as a measure of security. He is not suicidal today. He is encouraged to remember all of the crisis emergency calls and centers he can go to if he does become suicidal.  Carlos Porter is in casual clothes. His arms are covered with tattoos and has facial piercings in various areas. He is clearly distressed with depressed affect. He has spontaneous speech, as repeated no suicidal thoughts but tension and jittery legs arer apparent. He is depending on therapist JB2 reestablish permission to reestablish to  visit his wife. The R., B., and alternate choices of his medications have been discussed and he agrees to the medication changes.

## 2011-05-28 ENCOUNTER — Ambulatory Visit (HOSPITAL_COMMUNITY): Payer: Medicare Other | Admitting: Licensed Clinical Social Worker

## 2011-05-29 ENCOUNTER — Ambulatory Visit (HOSPITAL_COMMUNITY): Payer: Self-pay | Admitting: Psychiatry

## 2011-05-30 ENCOUNTER — Ambulatory Visit (HOSPITAL_COMMUNITY): Payer: Self-pay | Admitting: Psychiatry

## 2011-06-04 ENCOUNTER — Ambulatory Visit (HOSPITAL_COMMUNITY): Payer: Self-pay | Admitting: Licensed Clinical Social Worker

## 2011-06-05 ENCOUNTER — Ambulatory Visit (INDEPENDENT_AMBULATORY_CARE_PROVIDER_SITE_OTHER): Payer: Medicare Other | Admitting: Psychiatry

## 2011-06-05 VITALS — BP 128/86 | HR 90 | Ht 66.0 in | Wt 244.0 lb

## 2011-06-05 DIAGNOSIS — F3181 Bipolar II disorder: Secondary | ICD-10-CM

## 2011-06-05 DIAGNOSIS — F1310 Sedative, hypnotic or anxiolytic abuse, uncomplicated: Secondary | ICD-10-CM

## 2011-06-05 DIAGNOSIS — F1210 Cannabis abuse, uncomplicated: Secondary | ICD-10-CM

## 2011-06-05 DIAGNOSIS — F3189 Other bipolar disorder: Secondary | ICD-10-CM

## 2011-06-05 DIAGNOSIS — F1010 Alcohol abuse, uncomplicated: Secondary | ICD-10-CM
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
## 2011-06-07 ENCOUNTER — Ambulatory Visit (HOSPITAL_COMMUNITY): Payer: Self-pay | Admitting: Licensed Clinical Social Worker

## 2011-06-07 ENCOUNTER — Encounter (HOSPITAL_COMMUNITY): Payer: Self-pay | Admitting: Psychiatry

## 2011-06-07 DIAGNOSIS — F1210 Cannabis abuse, uncomplicated: Secondary | ICD-10-CM

## 2011-06-07 DIAGNOSIS — F1010 Alcohol abuse, uncomplicated: Secondary | ICD-10-CM

## 2011-06-07 DIAGNOSIS — F1310 Sedative, hypnotic or anxiolytic abuse, uncomplicated: Secondary | ICD-10-CM

## 2011-06-07 HISTORY — DX: Cannabis abuse, uncomplicated: F12.10

## 2011-06-07 HISTORY — DX: Alcohol abuse, uncomplicated: F10.10

## 2011-06-07 HISTORY — DX: Sedative, hypnotic or anxiolytic abuse, uncomplicated: F13.10

## 2011-06-26 ENCOUNTER — Emergency Department (HOSPITAL_BASED_OUTPATIENT_CLINIC_OR_DEPARTMENT_OTHER)
Admission: EM | Admit: 2011-06-26 | Discharge: 2011-06-26 | Disposition: A | Discharge status: Home / Self Care | Payer: Medicare Other | Attending: Emergency Medicine | Admitting: Emergency Medicine

## 2011-06-26 ENCOUNTER — Encounter (HOSPITAL_BASED_OUTPATIENT_CLINIC_OR_DEPARTMENT_OTHER): Payer: Self-pay

## 2011-06-26 ENCOUNTER — Emergency Department (INDEPENDENT_AMBULATORY_CARE_PROVIDER_SITE_OTHER): Payer: Medicare Other

## 2011-06-26 DIAGNOSIS — R51 Headache: Secondary | ICD-10-CM

## 2011-06-26 DIAGNOSIS — E119 Type 2 diabetes mellitus without complications: Secondary | ICD-10-CM

## 2011-06-26 DIAGNOSIS — J449 Chronic obstructive pulmonary disease, unspecified: Secondary | ICD-10-CM | POA: Insufficient documentation

## 2011-06-26 DIAGNOSIS — M542 Cervicalgia: Secondary | ICD-10-CM | POA: Insufficient documentation

## 2011-06-26 DIAGNOSIS — R110 Nausea: Secondary | ICD-10-CM | POA: Insufficient documentation

## 2011-06-26 DIAGNOSIS — I10 Essential (primary) hypertension: Secondary | ICD-10-CM

## 2011-06-26 DIAGNOSIS — H53149 Visual discomfort, unspecified: Secondary | ICD-10-CM | POA: Insufficient documentation

## 2011-06-26 DIAGNOSIS — J4489 Other specified chronic obstructive pulmonary disease: Secondary | ICD-10-CM | POA: Insufficient documentation

## 2011-06-26 DIAGNOSIS — E7800 Pure hypercholesterolemia, unspecified: Secondary | ICD-10-CM | POA: Insufficient documentation

## 2011-06-26 MED ORDER — METHYLPREDNISOLONE SODIUM SUCC 125 MG IJ SOLR
250.0000 mg | Freq: Once | INTRAMUSCULAR | Status: AC
  Administered 2011-06-26: 250 mg via INTRAVENOUS
  Filled 2011-06-26 (×2): qty 2

## 2011-06-26 MED ORDER — SODIUM CHLORIDE 0.9 % IV BOLUS (SEPSIS)
1000.0000 mL | Freq: Once | INTRAVENOUS | Status: AC
  Administered 2011-06-26: 1000 mL via INTRAVENOUS

## 2011-06-26 MED ORDER — DIPHENHYDRAMINE HCL 50 MG/ML IJ SOLN
25.0000 mg | Freq: Once | INTRAMUSCULAR | Status: AC
  Administered 2011-06-26: 25 mg via INTRAVENOUS
  Filled 2011-06-26: qty 1

## 2011-06-26 MED ORDER — PROMETHAZINE HCL 25 MG/ML IJ SOLN
25.0000 mg | Freq: Once | INTRAMUSCULAR | Status: AC
  Administered 2011-06-26: 25 mg via INTRAVENOUS
  Filled 2011-06-26: qty 1
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
## 2011-06-26 NOTE — Discharge Instructions (Signed)

## 2011-06-26 NOTE — ED Notes (Signed)
Pt reports headache and nausea x 3 days

## 2011-07-05 ENCOUNTER — Ambulatory Visit (INDEPENDENT_AMBULATORY_CARE_PROVIDER_SITE_OTHER): Payer: Medicare Other | Admitting: Psychiatry

## 2011-07-05 ENCOUNTER — Encounter (HOSPITAL_COMMUNITY): Payer: Self-pay | Admitting: Psychiatry

## 2011-07-05 VITALS — BP 134/94 | HR 90 | Ht 66.0 in | Wt 257.0 lb

## 2011-07-05 DIAGNOSIS — F319 Bipolar disorder, unspecified: Secondary | ICD-10-CM

## 2011-07-05 LAB — AMMONIA: Ammonia: 28 umol/L (ref 11–35)

## 2011-07-05 MED ORDER — CARBAMAZEPINE ER 100 MG PO CP12: 100.0000 mg | ORAL_CAPSULE | Freq: Two times a day (BID) | ORAL | Status: DC
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
## 2011-07-06 LAB — CBC WITH DIFFERENTIAL/PLATELET
Basophils Absolute: 0 10*3/uL (ref 0.0–0.1)
Eosinophils Relative: 1 % (ref 0–5)
HCT: 49.3 % (ref 39.0–52.0)
Hemoglobin: 16.4 g/dL (ref 13.0–17.0)
Lymphocytes Relative: 38 % (ref 12–46)
Lymphs Abs: 2.8 10*3/uL (ref 0.7–4.0)
MCV: 94.3 fL (ref 78.0–100.0)
Monocytes Absolute: 0.5 10*3/uL (ref 0.1–1.0)
Monocytes Relative: 7 % (ref 3–12)
RDW: 14.5 % (ref 11.5–15.5)
WBC: 7.4 10*3/uL (ref 4.0–10.5)

## 2011-07-06 LAB — T4, FREE: Free T4: 1.03 ng/dL (ref 0.80–1.80)

## 2011-07-06 LAB — T3, FREE: T3, Free: 3.3 pg/mL (ref 2.3–4.2)

## 2011-07-06 LAB — BUN: BUN: 7 mg/dL (ref 6–23)

## 2011-07-06 LAB — AST: AST: 18 U/L (ref 0–37)

## 2011-07-06 LAB — TSH: TSH: 0.57 u[IU]/mL (ref 0.350–4.500)

## 2011-07-10 ENCOUNTER — Telehealth (HOSPITAL_COMMUNITY): Payer: Self-pay | Admitting: Psychiatry

## 2011-07-10 NOTE — Telephone Encounter (Signed)
Called patient to report lab results. Advised him to start medications from today as directed, one tablet BID and call clinic after one week.

## 2011-08-05 ENCOUNTER — Ambulatory Visit (HOSPITAL_COMMUNITY): Payer: Self-pay | Admitting: Psychiatry

## 2011-09-30 ENCOUNTER — Emergency Department (HOSPITAL_BASED_OUTPATIENT_CLINIC_OR_DEPARTMENT_OTHER)
Admission: EM | Admit: 2011-09-30 | Discharge: 2011-10-01 | Disposition: A | Discharge status: Home / Self Care | Payer: Medicare Other | Attending: Emergency Medicine | Admitting: Emergency Medicine

## 2011-09-30 ENCOUNTER — Encounter (HOSPITAL_BASED_OUTPATIENT_CLINIC_OR_DEPARTMENT_OTHER): Payer: Self-pay | Admitting: Emergency Medicine

## 2011-09-30 DIAGNOSIS — M545 Low back pain, unspecified: Secondary | ICD-10-CM

## 2011-09-30 DIAGNOSIS — J4489 Other specified chronic obstructive pulmonary disease: Secondary | ICD-10-CM | POA: Insufficient documentation

## 2011-09-30 DIAGNOSIS — W108XXA Fall (on) (from) other stairs and steps, initial encounter: Secondary | ICD-10-CM | POA: Insufficient documentation

## 2011-09-30 DIAGNOSIS — S8392XA Sprain of unspecified site of left knee, initial encounter: Secondary | ICD-10-CM

## 2011-09-30 DIAGNOSIS — Z79899 Other long term (current) drug therapy: Secondary | ICD-10-CM | POA: Insufficient documentation

## 2011-09-30 DIAGNOSIS — IMO0002 Reserved for concepts with insufficient information to code with codable children: Secondary | ICD-10-CM | POA: Insufficient documentation

## 2011-09-30 DIAGNOSIS — J449 Chronic obstructive pulmonary disease, unspecified: Secondary | ICD-10-CM | POA: Insufficient documentation

## 2011-09-30 DIAGNOSIS — I10 Essential (primary) hypertension: Secondary | ICD-10-CM | POA: Insufficient documentation

## 2011-09-30 DIAGNOSIS — F17200 Nicotine dependence, unspecified, uncomplicated: Secondary | ICD-10-CM | POA: Insufficient documentation

## 2011-09-30 DIAGNOSIS — E119 Type 2 diabetes mellitus without complications: Secondary | ICD-10-CM | POA: Insufficient documentation

## 2011-09-30 DIAGNOSIS — F319 Bipolar disorder, unspecified: Secondary | ICD-10-CM | POA: Insufficient documentation

## 2011-09-30 HISTORY — DX: Bipolar disorder, unspecified: F31.9

## 2011-09-30 HISTORY — DX: Morbid (severe) obesity due to excess calories: E66.01

## 2011-09-30 NOTE — ED Notes (Signed)
Pt reports falling while moving a washing machine. Pt c/o lower back pain and left knee pain.

## 2011-10-01 ENCOUNTER — Emergency Department (HOSPITAL_BASED_OUTPATIENT_CLINIC_OR_DEPARTMENT_OTHER): Payer: Medicare Other

## 2011-10-01 MED ORDER — OXYCODONE-ACETAMINOPHEN 5-325 MG PO TABS: 1.0000 | ORAL_TABLET | Freq: Four times a day (QID) | ORAL | Status: AC | PRN

## 2011-10-01 MED ORDER — OXYCODONE-ACETAMINOPHEN 5-325 MG PO TABS
2.0000 | ORAL_TABLET | Freq: Once | ORAL | Status: AC
  Administered 2011-10-01: 2 via ORAL
  Filled 2011-10-01: qty 2

## 2011-10-01 NOTE — ED Notes (Signed)
MD at bedside. 

## 2011-10-01 NOTE — ED Notes (Signed)
Patient transported to X-ray 

## 2011-10-01 NOTE — ED Provider Notes (Signed)
History  This chart was scribed for Carlos Seamen, MD by [name]. This patient was seen in room MH12/MH12 and the patient's care was started at 12:24AM.  CSN: [id number]  Arrival date & time 09/30/11  2233   First MD Initiated Contact with Patient 10/01/11 0024      Chief Complaint  Patient presents with  . Fall     The history is provided by the patient. No language interpreter was used.    Carlos Porter is a 36 y.o. male who presents to the Emergency Department complaining of sudden onset, gradually worsening, constant, severe lower back pain that started after a fall while moving a washing machine with associated left knee pain. Pt reports that he slipped on a step and landed on his back and left knee. The pain is worse with movement. He reports no reduction in pain after taking ibuprofen. He denies numbness, weakness, CP, SOB, nausea and emesis as associated symptoms. He has h/o HTN, COPD and DM. He reports everyday tobacco use with no alcohol use.    Past Medical History  Diagnosis Date  . Diabetes mellitus   . Apnea   . Hypertension   . High cholesterol   . Lung disease   . COPD (chronic obstructive pulmonary disease)   . Carpal tunnel syndrome on both sides 2011    cannot affort splints to wear at night  . Marijuana abuse 06/07/2011  . Alcohol abuse 06/07/2011  . Benzodiazepine abuse 06/07/2011  . Obesity, morbid   . Bipolar disorder     Past Surgical History  Procedure Date  . Lung biopsy 2009  . Cardiac catheterization     No family history on file.  History  Substance Use Topics  . Smoking status: Current Everyday Smoker -- 0.5 packs/day for 24 years    Types: Cigarettes  . Smokeless tobacco: Not on file   Comment: Used to smoke 3-4 ppd Declines help  . Alcohol Use: No     hx used to drink but would black out and get into physical altercations.  No use in 1-2 years.      Review of Systems  Constitutional: Negative for fever and chills.    Respiratory: Negative for shortness of breath.   Cardiovascular: Negative for chest pain.  Gastrointestinal: Negative for nausea, vomiting, abdominal pain and diarrhea.  Musculoskeletal: Positive for back pain (para-spinal bilateral tenderness).  Neurological: Negative for weakness and numbness.  All other systems reviewed and are negative.    Allergies  Darvocet and Darvon  Home Medications   Current Outpatient Rx  Name Route Sig Dispense Refill  . ATORVASTATIN CALCIUM 40 MG PO TABS Oral Take 40 mg by mouth daily.    Marland Kitchen CARBAMAZEPINE ER 100 MG PO CP12 Oral Take 1 capsule (100 mg total) by mouth 2 (two) times daily. 60 capsule 2  . CARVEDILOL 25 MG PO TABS Oral Take 25 mg by mouth 2 (two) times daily with a meal.    . OMEGA-3 FATTY ACIDS 1000 MG PO CAPS Oral Take 2 g by mouth daily.    . IBUPROFEN 200 MG PO TABS Oral Take 800 mg by mouth every 6 (six) hours as needed. Patient took this medication for pain.    . INSULIN GLARGINE 100 UNIT/ML [place] SOLN Subcutaneous Inject 60 Units into the skin 2 (two) times daily.    Marland Kitchen LISINOPRIL 30 MG PO TABS Oral Take 30 mg by mouth daily.    Marland Kitchen METFORMIN HCL 1000 MG PO  TABS Oral Take 1,000 mg by mouth 2 (two) times daily with a meal.    . NAPROXEN SODIUM 220 MG PO TABS Oral Take 440 mg by mouth 2 (two) times daily with a meal. Patient took this medication for pain.    . TRAZODONE HCL 150 MG PO TABS Oral Take 1 tablet (150 mg total) by mouth at bedtime. 30 tablet 1    Triage vitals: BP 133/86  Pulse 79  Temp 97.6 F (36.4 C) (Oral)  Resp 18  Ht 5\' 6"  (1.676 m)  Wt 248 lb (112.492 kg)  BMI 40.03 kg/m2  SpO2 100%  Physical Exam  Nursing note and vitals reviewed. Constitutional: He is oriented to person, place, and time. He appears well-developed and well-nourished. No distress.  HENT:  Head: Normocephalic and atraumatic.  Eyes: EOM are normal.  Neck: Neck supple. No tracheal deviation present.  Cardiovascular: Normal rate.   Pulmonary/Chest:  Effort normal. No respiratory distress.  Abdominal: Soft. Bowel sounds are normal. There is no tenderness.  Musculoskeletal: Normal range of motion. He exhibits tenderness.       Left knee: He exhibits no deformity. tenderness found. MCL and patellar tendon tenderness noted.       Lumbar back: He exhibits tenderness (Para-spinal bilateral tenderness) and pain. He exhibits no deformity.       Legs:      Pain left straight leg raise   Neurological: He is alert and oriented to person, place, and time.  Skin: Skin is warm and dry.  Psychiatric: He has a normal mood and affect. His behavior is normal.    ED Course  Procedures (including critical care time)  DIAGNOSTIC STUDIES: Oxygen Saturation is 100% on room air, normal by my interpretation.    COORDINATION OF CARE: 12:29AM-Discussed treatment plan with pt at bedside and pt agreed to plan.Informed pt he could receive medication or shot for back and knee pain.   MDM   Nursing notes and vitals signs, including pulse oximetry, reviewed.  Summary of this visit's results, reviewed by myself:  Labs:  Results for orders placed in visit on 07/05/11  CBC WITH DIFFERENTIAL      Component Value Range   WBC 7.4  4.0 - 10.5 K/uL   RBC 5.23  4.22 - 5.81 MIL/uL   Hemoglobin 16.4  13.0 - 17.0 g/dL   HCT 84.1  32.4 - 40.1 %   MCV 94.3  78.0 - 100.0 fL   MCH 31.4  26.0 - 34.0 pg   MCHC 33.3  30.0 - 36.0 g/dL   RDW 02.7  25.3 - 66.4 %   Platelets 228  150 - 400 K/uL   Neutrophils Relative 54  43 - 77 %   Neutro Abs 4.0  1.7 - 7.7 K/uL   Lymphocytes Relative 38  12 - 46 %   Lymphs Abs 2.8  0.7 - 4.0 K/uL   Monocytes Relative 7  3 - 12 %   Monocytes Absolute 0.5  0.1 - 1.0 K/uL   Eosinophils Relative 1  0 - 5 %   Eosinophils Absolute 0.0  0.0 - 0.7 K/uL   Basophils Relative 0  0 - 1 %   Basophils Absolute 0.0  0.0 - 0.1 K/uL   Smear Review Criteria for review not met    ALT      Component Value Range   ALT 25  0 - 53 U/L  AST       Component Value Range   AST  18  0 - 37 U/L  AMMONIA      Component Value Range   Ammonia 28  11 - 35 umol/L  TSH      Component Value Range   TSH 0.570  0.350 - 4.500 uIU/mL  T3, FREE      Component Value Range   T3, Free 3.3  2.3 - 4.2 pg/mL  T4, FREE      Component Value Range   Free T4 1.03  0.80 - 1.80 ng/dL  BUN      Component Value Range   BUN 7  6 - 23 mg/dL  CREATININE, SERUM      Component Value Range   Creat 0.73  0.50 - 1.35 mg/dL    Imaging Studies: Dg Lumbar Spine Complete  10/01/2011  *RADIOLOGY REPORT*  Clinical Data: Low back pain after injury and fall.  LUMBAR SPINE - COMPLETE 4+ VIEW  Comparison: 12/04/2008  Findings: Five lumbar type vertebra.  Normal alignment of the lumbar vertebrae and facet joints.  No vertebral compression deformities.  Intervertebral disc space heights are preserved.  No focal bone lesion or bone destruction.  Bone cortex and trabecular architecture appear intact.  No significant change since previous study.  IMPRESSION: No displaced fractures identified.  Original Report Authenticated By: Marlon Pel, M.D.   Dg Knee 2 Views Left  10/01/2011  *RADIOLOGY REPORT*  Clinical Data: Left knee pain after fall.  LEFT KNEE - 1-2 VIEW  Comparison: 12/27/2008  Findings: The left knee appears intact. No evidence of acute fracture or subluxation.  No focal bone lesions.  Bone matrix and cortex appear intact.  No abnormal radiopaque densities in the soft tissues.  No significant change since previous study.  No effusion.  IMPRESSION: No acute bony abnormalities.  Original Report Authenticated By: Marlon Pel, M.D.     I personally performed the services described in this documentation, which was scribed in my presence.  The recorded information has been reviewed and considered.         Carlos Seamen, MD 10/01/11 [phone]

## 2011-11-26 ENCOUNTER — Emergency Department (HOSPITAL_BASED_OUTPATIENT_CLINIC_OR_DEPARTMENT_OTHER)
Admission: EM | Admit: 2011-11-26 | Discharge: 2011-11-26 | Disposition: A | Discharge status: Home / Self Care | Payer: Medicare Other | Attending: Emergency Medicine | Admitting: Emergency Medicine

## 2011-11-26 ENCOUNTER — Encounter (HOSPITAL_BASED_OUTPATIENT_CLINIC_OR_DEPARTMENT_OTHER): Payer: Self-pay

## 2011-11-26 DIAGNOSIS — F17200 Nicotine dependence, unspecified, uncomplicated: Secondary | ICD-10-CM | POA: Insufficient documentation

## 2011-11-26 DIAGNOSIS — J449 Chronic obstructive pulmonary disease, unspecified: Secondary | ICD-10-CM | POA: Insufficient documentation

## 2011-11-26 DIAGNOSIS — Z765 Malingerer [conscious simulation]: Secondary | ICD-10-CM | POA: Insufficient documentation

## 2011-11-26 DIAGNOSIS — I10 Essential (primary) hypertension: Secondary | ICD-10-CM | POA: Insufficient documentation

## 2011-11-26 DIAGNOSIS — M545 Low back pain, unspecified: Secondary | ICD-10-CM | POA: Insufficient documentation

## 2011-11-26 DIAGNOSIS — E119 Type 2 diabetes mellitus without complications: Secondary | ICD-10-CM | POA: Insufficient documentation

## 2011-11-26 DIAGNOSIS — J4489 Other specified chronic obstructive pulmonary disease: Secondary | ICD-10-CM | POA: Insufficient documentation

## 2011-11-26 DIAGNOSIS — F319 Bipolar disorder, unspecified: Secondary | ICD-10-CM | POA: Insufficient documentation

## 2011-11-26 DIAGNOSIS — Z794 Long term (current) use of insulin: Secondary | ICD-10-CM | POA: Insufficient documentation

## 2011-11-26 DIAGNOSIS — G8929 Other chronic pain: Secondary | ICD-10-CM

## 2011-11-26 DIAGNOSIS — E7800 Pure hypercholesterolemia, unspecified: Secondary | ICD-10-CM | POA: Insufficient documentation

## 2011-11-26 MED ORDER — CYCLOBENZAPRINE HCL 10 MG PO TABS
10.0000 mg | ORAL_TABLET | Freq: Once | ORAL | Status: AC
  Administered 2011-11-26: 10 mg via ORAL

## 2011-11-26 MED ORDER — CYCLOBENZAPRINE HCL 10 MG PO TABS
ORAL_TABLET | ORAL | Status: AC
  Filled 2011-11-26: qty 1

## 2011-11-26 NOTE — ED Provider Notes (Signed)
History     CSN: [id number]  Arrival date & time 11/26/11  1910   First MD Initiated Contact with Patient 11/26/11 1933      Chief Complaint  Patient presents with  . Fall    (Consider location/radiation/quality/duration/timing/severity/associated sxs/prior treatment) HPI Pt reports he was moving a washing machine today when he fell backwards, landing on his back. Denies head injury or LOC. Complaining of moderate aching lower back pain worse with movement.   Past Medical History  Diagnosis Date  . Diabetes mellitus   . Apnea   . Hypertension   . High cholesterol   . Lung disease   . COPD (chronic obstructive pulmonary disease)   . Carpal tunnel syndrome on both sides 2011    cannot affort splints to wear at night  . Marijuana abuse 06/07/2011  . Alcohol abuse 06/07/2011  . Benzodiazepine abuse 06/07/2011  . Obesity, morbid   . Bipolar disorder     Past Surgical History  Procedure Date  . Lung biopsy 2009  . Cardiac catheterization     No family history on file.  History  Substance Use Topics  . Smoking status: Current Everyday Smoker -- 0.5 packs/day for 24 years    Types: Cigarettes  . Smokeless tobacco: Not on file   Comment: Used to smoke 3-4 ppd Declines help  . Alcohol Use: No     .      Review of Systems All other systems reviewed and are negative except as noted in HPI.   Allergies  Darvocet and Darvon  Home Medications   Current Outpatient Rx  Name Route Sig Dispense Refill  . ATORVASTATIN CALCIUM 40 MG PO TABS Oral Take 40 mg by mouth daily.    Marland Kitchen CARBAMAZEPINE ER 100 MG PO CP12 Oral Take 1 capsule (100 mg total) by mouth 2 (two) times daily. 60 capsule 2  . CARVEDILOL 25 MG PO TABS Oral Take 25 mg by mouth 2 (two) times daily with a meal.    . OMEGA-3 FATTY ACIDS 1000 MG PO CAPS Oral Take 2 g by mouth daily.    . IBUPROFEN 200 MG PO TABS Oral Take 800 mg by mouth every 6 (six) hours as needed. Patient took this medication for pain.    .  INSULIN GLARGINE 100 UNIT/ML [place] SOLN Subcutaneous Inject 60 Units into the skin 2 (two) times daily.    Marland Kitchen LISINOPRIL 30 MG PO TABS Oral Take 30 mg by mouth daily.    Marland Kitchen METFORMIN HCL 1000 MG PO TABS Oral Take 1,000 mg by mouth 2 (two) times daily with a meal.    . NAPROXEN SODIUM 220 MG PO TABS Oral Take 440 mg by mouth 2 (two) times daily with a meal. Patient took this medication for pain.    . TRAZODONE HCL 150 MG PO TABS Oral Take 1 tablet (150 mg total) by mouth at bedtime. 30 tablet 1    BP 145/80  Pulse 98  Resp 20  Ht 5\' 6"  (1.676 m)  Wt 238 lb (107.956 kg)  BMI 38.41 kg/m2  SpO2 97%  Physical Exam  Nursing note and vitals reviewed. Constitutional: He is oriented to person, place, and time. He appears well-developed and well-nourished.  HENT:  Head: Normocephalic and atraumatic.  Eyes: EOM are normal. Pupils are equal, round, and reactive to light.  Neck: Normal range of motion. Neck supple.  Cardiovascular: Normal rate, normal heart sounds and intact distal pulses.   Pulmonary/Chest: Effort normal and breath  sounds normal.  Abdominal: Bowel sounds are normal. He exhibits no distension. There is no tenderness.  Musculoskeletal: Normal range of motion. He exhibits no edema and no tenderness.       Lumbar back: He exhibits no bony tenderness.  Neurological: He is alert and oriented to person, place, and time. He has normal strength. No cranial nerve deficit or sensory deficit.  Skin: Skin is warm and dry. No rash noted.  Psychiatric: He has a normal mood and affect.    ED Course  Procedures (including critical care time)  Labs Reviewed - No data to display No results found.   No diagnosis found.    MDM  Review of patient's prior visits reveals a visit for identical history and complaints (fell moving washing machine). He denies having used this complaints before but it is clearly present on both triage and physician notes. Pt has no concerning physical exam findings.  Review of Controlled Substance database reveals a Rx for hydrocodone from a pain management clinic in [place] which he also denied at first. Advised that he should followup with them for long term pain control. I suspect he is malingering.       Shaleah Nissley B. Bernette Mayers, MD 11/26/11 1945

## 2011-11-26 NOTE — ED Notes (Signed)
Fell down steps approx 3-4pm-while moving washing machine-pain to lower back radiating to lower legs and neck pain

## 2012-01-02 ENCOUNTER — Encounter (HOSPITAL_COMMUNITY): Payer: Self-pay | Admitting: Psychiatry

## 2012-01-26 ENCOUNTER — Emergency Department (HOSPITAL_BASED_OUTPATIENT_CLINIC_OR_DEPARTMENT_OTHER)
Admission: EM | Admit: 2012-01-26 | Discharge: 2012-01-26 | Disposition: A | Discharge status: Home / Self Care | Payer: Medicare Other | Attending: Emergency Medicine | Admitting: Emergency Medicine

## 2012-01-26 ENCOUNTER — Encounter (HOSPITAL_BASED_OUTPATIENT_CLINIC_OR_DEPARTMENT_OTHER): Payer: Self-pay | Admitting: *Deleted

## 2012-01-26 ENCOUNTER — Emergency Department (HOSPITAL_BASED_OUTPATIENT_CLINIC_OR_DEPARTMENT_OTHER): Payer: Medicare Other

## 2012-01-26 DIAGNOSIS — J449 Chronic obstructive pulmonary disease, unspecified: Secondary | ICD-10-CM | POA: Insufficient documentation

## 2012-01-26 DIAGNOSIS — F17200 Nicotine dependence, unspecified, uncomplicated: Secondary | ICD-10-CM | POA: Insufficient documentation

## 2012-01-26 DIAGNOSIS — F319 Bipolar disorder, unspecified: Secondary | ICD-10-CM | POA: Insufficient documentation

## 2012-01-26 DIAGNOSIS — S93409A Sprain of unspecified ligament of unspecified ankle, initial encounter: Secondary | ICD-10-CM

## 2012-01-26 DIAGNOSIS — F1021 Alcohol dependence, in remission: Secondary | ICD-10-CM | POA: Insufficient documentation

## 2012-01-26 DIAGNOSIS — Z8709 Personal history of other diseases of the respiratory system: Secondary | ICD-10-CM | POA: Insufficient documentation

## 2012-01-26 DIAGNOSIS — X500XXA Overexertion from strenuous movement or load, initial encounter: Secondary | ICD-10-CM | POA: Insufficient documentation

## 2012-01-26 DIAGNOSIS — Z794 Long term (current) use of insulin: Secondary | ICD-10-CM | POA: Insufficient documentation

## 2012-01-26 DIAGNOSIS — E7800 Pure hypercholesterolemia, unspecified: Secondary | ICD-10-CM | POA: Insufficient documentation

## 2012-01-26 DIAGNOSIS — J4489 Other specified chronic obstructive pulmonary disease: Secondary | ICD-10-CM | POA: Insufficient documentation

## 2012-01-26 DIAGNOSIS — R0681 Apnea, not elsewhere classified: Secondary | ICD-10-CM | POA: Insufficient documentation

## 2012-01-26 DIAGNOSIS — I10 Essential (primary) hypertension: Secondary | ICD-10-CM | POA: Insufficient documentation

## 2012-01-26 DIAGNOSIS — G5600 Carpal tunnel syndrome, unspecified upper limb: Secondary | ICD-10-CM | POA: Insufficient documentation

## 2012-01-26 DIAGNOSIS — F1910 Other psychoactive substance abuse, uncomplicated: Secondary | ICD-10-CM | POA: Insufficient documentation

## 2012-01-26 DIAGNOSIS — E119 Type 2 diabetes mellitus without complications: Secondary | ICD-10-CM | POA: Insufficient documentation

## 2012-01-26 DIAGNOSIS — Y93H1 Activity, digging, shoveling and raking: Secondary | ICD-10-CM | POA: Insufficient documentation

## 2012-01-26 DIAGNOSIS — Y92009 Unspecified place in unspecified non-institutional (private) residence as the place of occurrence of the external cause: Secondary | ICD-10-CM | POA: Insufficient documentation

## 2012-01-26 DIAGNOSIS — Z79899 Other long term (current) drug therapy: Secondary | ICD-10-CM | POA: Insufficient documentation

## 2012-01-26 LAB — GLUCOSE, CAPILLARY: Glucose-Capillary: 86 mg/dL (ref 70–99)

## 2012-01-26 MED ORDER — HYDROCODONE-ACETAMINOPHEN 5-500 MG PO TABS: 1.0000 | ORAL_TABLET | Freq: Four times a day (QID) | ORAL | Status: DC | PRN

## 2012-01-26 NOTE — ED Provider Notes (Signed)
History    This chart was scribed for [name], MD, MD by Smitty Pluck, ED Scribe. The patient was seen in room MH06 and the patient's care was started at 3:48PM.   CSN: [id number]  Arrival date & time 01/26/12  1506    Chief Complaint  Patient presents with  . Ankle Pain    (Consider location/radiation/quality/duration/timing/severity/associated sxs/prior treatment) The history is provided by the patient. No language interpreter was used.   Carlos Porter is a 36 y.o. male who presents to the Emergency Department complaining of constant, moderate right ankle pain onset today. Family reports that pt twisted his ankle by stepping into hole in yard while raking leaves. Pt has hx of broken right ankle. Pt is unable to ambulate due to pain. Pt has taken motrin PTA without relief. Pt denies head injury, radiation and any other pain.   Past Medical History  Diagnosis Date  . Diabetes mellitus   . Apnea   . Hypertension   . High cholesterol   . Lung disease   . COPD (chronic obstructive pulmonary disease)   . Carpal tunnel syndrome on both sides 2011    cannot affort splints to wear at night  . Marijuana abuse 06/07/2011  . Alcohol abuse 06/07/2011  . Benzodiazepine abuse 06/07/2011  . Obesity, morbid   . Bipolar disorder     Past Surgical History  Procedure Date  . Lung biopsy 2009  . Cardiac catheterization   . Dental surgery   . Root canal     No family history on file.  History  Substance Use Topics  . Smoking status: Current Every Day Smoker -- 0.5 packs/day for 24 years    Types: Cigarettes  . Smokeless tobacco: Never Used     Comment: Used to smoke 3-4 ppd Declines help  . Alcohol Use: No     Comment: .      Review of Systems  All other systems reviewed and are negative.   10 Systems reviewed and all are negative for acute change except as noted in the HPI.   Allergies  Darvocet; Darvon; and Morphine and related  Home Medications   Current  Outpatient Rx  Name  Route  Sig  Dispense  Refill  . ATORVASTATIN CALCIUM 40 MG PO TABS   Oral   Take 40 mg by mouth daily.         Marland Kitchen CARVEDILOL 25 MG PO TABS   Oral   Take 25 mg by mouth 2 (two) times daily with a meal.         . OMEGA-3 FATTY ACIDS 1000 MG PO CAPS   Oral   Take 2 g by mouth daily.         . IBUPROFEN 200 MG PO TABS   Oral   Take 800 mg by mouth every 6 (six) hours as needed. Patient took this medication for pain.         . INSULIN GLARGINE 100 UNIT/ML [place] SOLN   Subcutaneous   Inject 60 Units into the skin 2 (two) times daily.         Marland Kitchen LISINOPRIL 30 MG PO TABS   Oral   Take 30 mg by mouth daily.         Marland Kitchen METFORMIN HCL 1000 MG PO TABS   Oral   Take 1,000 mg by mouth 2 (two) times daily with a meal.         . NAPROXEN SODIUM 220 MG PO TABS  Oral   Take 440 mg by mouth 2 (two) times daily with a meal. Patient took this medication for pain.         Marland Kitchen CARBAMAZEPINE ER 100 MG PO CP12   Oral   Take 1 capsule (100 mg total) by mouth 2 (two) times daily.   60 capsule   2   . TRAZODONE HCL 150 MG PO TABS   Oral   Take 1 tablet (150 mg total) by mouth at bedtime.   30 tablet   1     BP 118/65  Pulse 90  Temp 98.2 F (36.8 C) (Oral)  Resp 20  Ht 5\' 6"  (1.676 m)  Wt 230 lb (104.327 kg)  BMI 37.12 kg/m2  SpO2 100%  Physical Exam  Nursing note and vitals reviewed. Constitutional: He is oriented to person, place, and time. He appears well-developed and well-nourished. No distress.  HENT:  Head: Normocephalic and atraumatic.  Eyes: EOM are normal. Pupils are equal, round, and reactive to light.  Neck: Normal range of motion. Neck supple. No tracheal deviation present.  Pulmonary/Chest: Effort normal. No respiratory distress.  Abdominal: Soft. He exhibits no distension.  Musculoskeletal:       Mild to moderate swelling over right lateral malleolus No right medial malleolus tenderness  Left ankle stable  Nl pulses      Neurological: He is alert and oriented to person, place, and time.  Skin: Skin is warm and dry.  Psychiatric: He has a normal mood and affect. His behavior is normal.    ED Course  Procedures (including critical care time) DIAGNOSTIC STUDIES: Oxygen Saturation is 100% on room air, normal by my interpretation.    COORDINATION OF CARE: 3:52 PM Discussed ED treatment with pt     Labs Reviewed - No data to display No results found.   No diagnosis found.    MDM  This patient presents after an ankle inversion while raking leaves.  He is quite dramatic and his pain is out of proportion with what I am seeing on exam and xray.  I have reviewed his chart and it appears as though he has displayed what other physicians have believed is drug seeking behavior.  Nevertheless, he will be given a small number of hydrocodone, ace bandage, and crutches, and is to follow up with his pcp if not improving in the next 2 weeks.      I personally performed the services described in this documentation, which was scribed in my presence. The recorded information has been reviewed and is accurate.     [name], MD 01/27/12 [phone]

## 2012-01-26 NOTE — Progress Notes (Signed)
Patient fitted appropriately for crutches. Unable to effectively educate patient on crutch use. Patient uncooperative with instruction. States "I have done this before honey". Patient family member advised to call back to department for any questions regarding crutch use.

## 2012-01-26 NOTE — ED Notes (Signed)
Pt reports he twisted his ankle in a rabbit hole while raking leaves- right ankle swollen, unable to ambulate due to pain

## 2012-01-26 NOTE — ED Notes (Signed)
Pt was taken to xray

## 2012-02-04 ENCOUNTER — Telehealth (HOSPITAL_COMMUNITY): Payer: Self-pay | Admitting: Psychiatry

## 2012-02-04 NOTE — Telephone Encounter (Signed)
Called patient's pharmacy, the patient has never filled Carbamazepine, and has not filled trazodone.  The patient was a No Show for his appointment on 08/05/2011 and has not rescheduled appointments.   PLAN: As the patient has not filled his medications I have discontinued trazodone and carbamazepine. I have sent a letter discharging the patient from this clinic on 01/02/2012.

## 2012-02-09 ENCOUNTER — Emergency Department (HOSPITAL_BASED_OUTPATIENT_CLINIC_OR_DEPARTMENT_OTHER)
Admission: EM | Admit: 2012-02-09 | Discharge: 2012-02-09 | Disposition: A | Discharge status: Home / Self Care | Payer: Medicare Other | Attending: Emergency Medicine | Admitting: Emergency Medicine

## 2012-02-09 ENCOUNTER — Encounter (HOSPITAL_BASED_OUTPATIENT_CLINIC_OR_DEPARTMENT_OTHER): Payer: Self-pay

## 2012-02-09 DIAGNOSIS — K1379 Other lesions of oral mucosa: Secondary | ICD-10-CM

## 2012-02-09 DIAGNOSIS — Z79899 Other long term (current) drug therapy: Secondary | ICD-10-CM | POA: Insufficient documentation

## 2012-02-09 DIAGNOSIS — F1910 Other psychoactive substance abuse, uncomplicated: Secondary | ICD-10-CM | POA: Insufficient documentation

## 2012-02-09 DIAGNOSIS — J984 Other disorders of lung: Secondary | ICD-10-CM | POA: Insufficient documentation

## 2012-02-09 DIAGNOSIS — F319 Bipolar disorder, unspecified: Secondary | ICD-10-CM | POA: Insufficient documentation

## 2012-02-09 DIAGNOSIS — J4489 Other specified chronic obstructive pulmonary disease: Secondary | ICD-10-CM | POA: Insufficient documentation

## 2012-02-09 DIAGNOSIS — Z794 Long term (current) use of insulin: Secondary | ICD-10-CM | POA: Insufficient documentation

## 2012-02-09 DIAGNOSIS — Z765 Malingerer [conscious simulation]: Secondary | ICD-10-CM | POA: Insufficient documentation

## 2012-02-09 DIAGNOSIS — Z8669 Personal history of other diseases of the nervous system and sense organs: Secondary | ICD-10-CM | POA: Insufficient documentation

## 2012-02-09 DIAGNOSIS — F1010 Alcohol abuse, uncomplicated: Secondary | ICD-10-CM | POA: Insufficient documentation

## 2012-02-09 DIAGNOSIS — F1310 Sedative, hypnotic or anxiolytic abuse, uncomplicated: Secondary | ICD-10-CM | POA: Insufficient documentation

## 2012-02-09 DIAGNOSIS — E119 Type 2 diabetes mellitus without complications: Secondary | ICD-10-CM | POA: Insufficient documentation

## 2012-02-09 DIAGNOSIS — F17200 Nicotine dependence, unspecified, uncomplicated: Secondary | ICD-10-CM | POA: Insufficient documentation

## 2012-02-09 DIAGNOSIS — R0681 Apnea, not elsewhere classified: Secondary | ICD-10-CM | POA: Insufficient documentation

## 2012-02-09 DIAGNOSIS — K1370 Unspecified lesions of oral mucosa: Secondary | ICD-10-CM | POA: Insufficient documentation

## 2012-02-09 DIAGNOSIS — E7800 Pure hypercholesterolemia, unspecified: Secondary | ICD-10-CM | POA: Insufficient documentation

## 2012-02-09 DIAGNOSIS — F1210 Cannabis abuse, uncomplicated: Secondary | ICD-10-CM | POA: Insufficient documentation

## 2012-02-09 DIAGNOSIS — J449 Chronic obstructive pulmonary disease, unspecified: Secondary | ICD-10-CM | POA: Insufficient documentation

## 2012-02-09 DIAGNOSIS — I10 Essential (primary) hypertension: Secondary | ICD-10-CM | POA: Insufficient documentation

## 2012-02-09 LAB — SALICYLATE LEVEL: Salicylate Lvl: <2 mg/dL — ABNORMAL LOW (ref 2.8–20.0)

## 2012-02-09 LAB — BASIC METABOLIC PANEL
BUN: 19 mg/dL (ref 6–23)
Creatinine, Ser: 0.7 mg/dL (ref 0.50–1.35)
GFR calc Af Amer: >90 mL/min (ref >90)
GFR calc non Af Amer: >90 mL/min (ref >90)

## 2012-02-09 MED ORDER — CHLORHEXIDINE GLUCONATE 0.12 % MT SOLN: 15.0000 mL | Freq: Two times a day (BID) | OROMUCOSAL | Status: DC

## 2012-02-09 MED ORDER — LIDOCAINE VISCOUS 2 % MT SOLN
20.0000 mL | Freq: Once | OROMUCOSAL | Status: AC
  Administered 2012-02-09: 20 mL via OROMUCOSAL
  Filled 2012-02-09: qty 15

## 2012-02-09 MED ORDER — KETOROLAC TROMETHAMINE 30 MG/ML IJ SOLN
30.0000 mg | Freq: Once | INTRAMUSCULAR | Status: AC
  Administered 2012-02-09: 30 mg via INTRAMUSCULAR
  Filled 2012-02-09: qty 1

## 2012-02-09 NOTE — ED Notes (Signed)
Patient reports that he had dental surgery last Monday, had tooth extracted, reports pain left lower gum. Reports bleeding from same, stitches have fallen out

## 2012-02-09 NOTE — ED Provider Notes (Addendum)
History     CSN: [id number]  Arrival date & time 02/09/12  0224   First MD Initiated Contact with Patient 02/09/12 [phone]      Chief Complaint  Patient presents with  . Dental Pain    (Consider location/radiation/quality/duration/timing/severity/associated sxs/prior treatment) Patient is a 36 y.o. male presenting with tooth pain. The history is provided by the patient and the spouse.  Dental PainThe primary symptoms include mouth pain. Primary symptoms do not include fever. The symptoms began 5 to 7 days ago. The symptoms are worsening. The symptoms are new. The symptoms occur constantly.  Mouth pain began 5 - 7 days ago. Mouth pain occurs constantly. Mouth pain is worsening. Affected locations include: gum(s). At its highest the mouth pain was at 10/10. The mouth pain is currently at 10/10.  Additional symptoms do not include: trismus, facial swelling and drooling. Medical issues include: smoking.  Patient reports that a dental provider in [place] whose name he can't remember extracted his left molar wisdom tooth and 3rd molar on Monday of this week.  He states he was told he could start smoking 2 days after the procedure.  States he is on no antibiotics and was only given an RX for 10 vicodin for the dentist following the procedure.  States he called a number this week because the pain was increasing.  No f/c/r.  No facial swelling.  States he is taking ibuprofen only for the pain but despite taking several at a time it is not helping.    Past Medical History  Diagnosis Date  . Diabetes mellitus   . Apnea   . Hypertension   . High cholesterol   . Lung disease   . COPD (chronic obstructive pulmonary disease)   . Carpal tunnel syndrome on both sides 2011    cannot affort splints to wear at night  . Marijuana abuse 06/07/2011  . Alcohol abuse 06/07/2011  . Benzodiazepine abuse 06/07/2011  . Obesity, morbid   . Bipolar disorder     Past Surgical History  Procedure Date  . Lung  biopsy 2009  . Cardiac catheterization   . Dental surgery   . Root canal     No family history on file.  History  Substance Use Topics  . Smoking status: Current Every Day Smoker -- 0.5 packs/day for 24 years    Types: Cigarettes  . Smokeless tobacco: Never Used     Comment: Used to smoke 3-4 ppd Declines help  . Alcohol Use: No     Comment: .      Review of Systems  Constitutional: Negative for fever.  HENT: Negative for facial swelling and drooling.   All other systems reviewed and are negative.    Allergies  Darvocet; Darvon; and Morphine and related  Home Medications   Current Outpatient Rx  Name  Route  Sig  Dispense  Refill  . ATORVASTATIN CALCIUM 40 MG PO TABS   Oral   Take 40 mg by mouth daily.         Marland Kitchen CARVEDILOL 25 MG PO TABS   Oral   Take 25 mg by mouth 2 (two) times daily with a meal.         . OMEGA-3 FATTY ACIDS 1000 MG PO CAPS   Oral   Take 2 g by mouth daily.         Marland Kitchen HYDROCODONE-ACETAMINOPHEN 5-500 MG PO TABS   Oral   Take 1-2 tablets by mouth every 6 (six) hours as  needed for pain.   15 tablet   0   . IBUPROFEN 200 MG PO TABS   Oral   Take 800 mg by mouth every 6 (six) hours as needed. Patient took this medication for pain.         . INSULIN GLARGINE 100 UNIT/ML [place] SOLN   Subcutaneous   Inject 60 Units into the skin 2 (two) times daily.         Marland Kitchen LISINOPRIL 30 MG PO TABS   Oral   Take 30 mg by mouth daily.         Marland Kitchen METFORMIN HCL 1000 MG PO TABS   Oral   Take 1,000 mg by mouth 2 (two) times daily with a meal.         . NAPROXEN SODIUM 220 MG PO TABS   Oral   Take 440 mg by mouth 2 (two) times daily with a meal. Patient took this medication for pain.         . TRAZODONE HCL 150 MG PO TABS   Oral   Take 1 tablet (150 mg total) by mouth at bedtime.   30 tablet   1     BP 128/81  Pulse 101  Temp 97.5 F (36.4 C) (Oral)  Resp 18  SpO2 99%  Physical Exam  Constitutional: He is oriented to person,  place, and time. He appears well-developed and well-nourished. No distress.  HENT:  Head: Normocephalic and atraumatic. No trismus in the jaw.  Mouth/Throat: No dental abscesses or uvula swelling.    Eyes: Conjunctivae normal are normal. Pupils are equal, round, and reactive to light.  Neck: Neck supple.  Cardiovascular: Normal rate and regular rhythm.   Pulmonary/Chest: Effort normal and breath sounds normal.  Abdominal: Soft. Bowel sounds are normal.  Musculoskeletal: Normal range of motion.  Lymphadenopathy:    He has no cervical adenopathy.  Neurological: He is alert and oriented to person, place, and time.  Skin: Skin is warm and dry.    ED Course  Dental Performed by: Jasmine Awe Authorized by: Jasmine Awe Patient identity confirmed: arm band Local anesthesia used: yes Local anesthetic: viscous lidocaine. Patient sedated: no Patient tolerance: Patient tolerated the procedure well with no immediate complications. Comments: Irrigated copiously with sterile saline and packed with eugenol soaked packing.     (including critical care time)  Labs Reviewed  SALICYLATE LEVEL - Abnormal; Notable for the following:    Salicylate Lvl <2.0 (*)     All other components within normal limits  BASIC METABOLIC PANEL - Abnormal; Notable for the following:    Glucose, Bld 102 (*)     All other components within normal limits   No results found.   1. Pain in mouth       MDM  Patient is acting out, pain is out of proportion to exam and exam is not consistent with stated time frame for dental extractions. Suspect drug seeking behavior.  States he cannot recall who did oral surgery on him that he reports was Monday.  Extraction sites appear to be older than 7 days.  States he cannot find dentist's card.  States dentist dispensed 10 vicodin at the time of the extraction.  According to [facility] database Dr. Josetta Huddle a dentist in [place] wrote an RX for 10 vicodin on  01/15/12.  Patient has since received 10 vicodin 01/26/12, 45 percocet 10/325 on 01/30/12  And 45 Oxy 7.5/325 on 01/28/12 from another provider.  EDP will  not prescribe any narcotics.  Have called and left message for dentist.  Have prescribed peridex mouth rinse.       Jasmine Awe, MD 02/09/12 1610  Chaniah Cisse K Mekaila Tarnow-Rasch, MD 02/09/12 [phone]

## 2012-04-14 ENCOUNTER — Encounter (HOSPITAL_BASED_OUTPATIENT_CLINIC_OR_DEPARTMENT_OTHER): Payer: Self-pay | Admitting: *Deleted

## 2012-04-14 ENCOUNTER — Emergency Department (HOSPITAL_BASED_OUTPATIENT_CLINIC_OR_DEPARTMENT_OTHER): Payer: Medicare Other

## 2012-04-14 ENCOUNTER — Emergency Department (HOSPITAL_BASED_OUTPATIENT_CLINIC_OR_DEPARTMENT_OTHER)
Admission: EM | Admit: 2012-04-14 | Discharge: 2012-04-14 | Disposition: A | Discharge status: Home / Self Care | Payer: Medicare Other | Attending: Emergency Medicine | Admitting: Emergency Medicine

## 2012-04-14 DIAGNOSIS — F1210 Cannabis abuse, uncomplicated: Secondary | ICD-10-CM | POA: Insufficient documentation

## 2012-04-14 DIAGNOSIS — S53401A Unspecified sprain of right elbow, initial encounter: Secondary | ICD-10-CM

## 2012-04-14 DIAGNOSIS — IMO0002 Reserved for concepts with insufficient information to code with codable children: Secondary | ICD-10-CM | POA: Insufficient documentation

## 2012-04-14 DIAGNOSIS — Z8709 Personal history of other diseases of the respiratory system: Secondary | ICD-10-CM | POA: Insufficient documentation

## 2012-04-14 DIAGNOSIS — Z79899 Other long term (current) drug therapy: Secondary | ICD-10-CM | POA: Insufficient documentation

## 2012-04-14 DIAGNOSIS — F1010 Alcohol abuse, uncomplicated: Secondary | ICD-10-CM | POA: Insufficient documentation

## 2012-04-14 DIAGNOSIS — F17200 Nicotine dependence, unspecified, uncomplicated: Secondary | ICD-10-CM | POA: Insufficient documentation

## 2012-04-14 DIAGNOSIS — Y9389 Activity, other specified: Secondary | ICD-10-CM | POA: Insufficient documentation

## 2012-04-14 DIAGNOSIS — Z8669 Personal history of other diseases of the nervous system and sense organs: Secondary | ICD-10-CM | POA: Insufficient documentation

## 2012-04-14 DIAGNOSIS — E7800 Pure hypercholesterolemia, unspecified: Secondary | ICD-10-CM | POA: Insufficient documentation

## 2012-04-14 DIAGNOSIS — J449 Chronic obstructive pulmonary disease, unspecified: Secondary | ICD-10-CM | POA: Insufficient documentation

## 2012-04-14 DIAGNOSIS — X500XXA Overexertion from strenuous movement or load, initial encounter: Secondary | ICD-10-CM | POA: Insufficient documentation

## 2012-04-14 DIAGNOSIS — E119 Type 2 diabetes mellitus without complications: Secondary | ICD-10-CM | POA: Insufficient documentation

## 2012-04-14 DIAGNOSIS — J4489 Other specified chronic obstructive pulmonary disease: Secondary | ICD-10-CM | POA: Insufficient documentation

## 2012-04-14 DIAGNOSIS — Z794 Long term (current) use of insulin: Secondary | ICD-10-CM | POA: Insufficient documentation

## 2012-04-14 DIAGNOSIS — M549 Dorsalgia, unspecified: Secondary | ICD-10-CM

## 2012-04-14 DIAGNOSIS — F319 Bipolar disorder, unspecified: Secondary | ICD-10-CM | POA: Insufficient documentation

## 2012-04-14 DIAGNOSIS — I10 Essential (primary) hypertension: Secondary | ICD-10-CM | POA: Insufficient documentation

## 2012-04-14 DIAGNOSIS — Y929 Unspecified place or not applicable: Secondary | ICD-10-CM | POA: Insufficient documentation

## 2012-04-14 MED ORDER — IBUPROFEN 800 MG PO TABS: 800.0000 mg | ORAL_TABLET | Freq: Three times a day (TID) | ORAL | Status: DC

## 2012-04-14 MED ORDER — HYDROCODONE-ACETAMINOPHEN 5-325 MG PO TABS: 2.0000 | ORAL_TABLET | ORAL | Status: DC | PRN

## 2012-04-14 MED ORDER — OXYCODONE-ACETAMINOPHEN 5-325 MG PO TABS
2.0000 | ORAL_TABLET | Freq: Once | ORAL | Status: AC
  Administered 2012-04-14: 2 via ORAL
  Filled 2012-04-14 (×2): qty 2

## 2012-04-14 NOTE — ED Provider Notes (Signed)
History     CSN: [id number]  Arrival date & time 04/14/12  1152   First MD Initiated Contact with Patient 04/14/12 1207      Chief Complaint  Patient presents with  . Elbow Pain  . Shoulder Pain    (Consider location/radiation/quality/duration/timing/severity/associated sxs/prior treatment) Patient is a 37 y.o. male presenting with extremity pain. The history is provided by the patient. No language interpreter was used.  Extremity Pain This is a new problem. The current episode started yesterday. The problem occurs constantly. The problem has been gradually worsening. Associated symptoms include myalgias. Pertinent negatives include no joint swelling. Nothing aggravates the symptoms. He has tried nothing for the symptoms. The treatment provided no relief.  Pt reports he was lifting sheet metal yesterday and had pain in his back  Past Medical History  Diagnosis Date  . Diabetes mellitus   . Apnea   . Hypertension   . High cholesterol   . Lung disease   . COPD (chronic obstructive pulmonary disease)   . Carpal tunnel syndrome on both sides 2011    cannot affort splints to wear at night  . Marijuana abuse 06/07/2011  . Alcohol abuse 06/07/2011  . Benzodiazepine abuse 06/07/2011  . Obesity, morbid   . Bipolar disorder     Past Surgical History  Procedure Date  . Lung biopsy 2009  . Cardiac catheterization   . Dental surgery   . Root canal     History reviewed. No pertinent family history.  History  Substance Use Topics  . Smoking status: Current Every Day Smoker -- 0.5 packs/day for 24 years    Types: Cigarettes  . Smokeless tobacco: Never Used     Comment: Used to smoke 3-4 ppd Declines help  . Alcohol Use: No     Comment: .      Review of Systems  Musculoskeletal: Positive for myalgias. Negative for joint swelling.  All other systems reviewed and are negative.    Allergies  Darvocet; Darvon; Keflex; and Morphine and related  Home Medications   Current  Outpatient Rx  Name  Route  Sig  Dispense  Refill  . ATORVASTATIN CALCIUM 40 MG PO TABS   Oral   Take 40 mg by mouth daily.         Marland Kitchen CARVEDILOL 25 MG PO TABS   Oral   Take 25 mg by mouth 2 (two) times daily with a meal.         . CHLORHEXIDINE GLUCONATE 0.12 % [place] SOLN   Mouth/Throat   Use as directed 15 mLs in the mouth or throat 2 (two) times daily.   120 mL   0   . OMEGA-3 FATTY ACIDS 1000 MG PO CAPS   Oral   Take 2 g by mouth daily.         Marland Kitchen HYDROCODONE-ACETAMINOPHEN 5-500 MG PO TABS   Oral   Take 1-2 tablets by mouth every 6 (six) hours as needed for pain.   15 tablet   0   . IBUPROFEN 200 MG PO TABS   Oral   Take 800 mg by mouth every 6 (six) hours as needed. Patient took this medication for pain.         . INSULIN GLARGINE 100 UNIT/ML [place] SOLN   Subcutaneous   Inject 60 Units into the skin 2 (two) times daily.         Marland Kitchen LISINOPRIL 30 MG PO TABS   Oral   Take 30 mg  by mouth daily.         Marland Kitchen METFORMIN HCL 1000 MG PO TABS   Oral   Take 1,000 mg by mouth 2 (two) times daily with a meal.         . NAPROXEN SODIUM 220 MG PO TABS   Oral   Take 440 mg by mouth 2 (two) times daily with a meal. Patient took this medication for pain.         . TRAZODONE HCL 150 MG PO TABS   Oral   Take 1 tablet (150 mg total) by mouth at bedtime.   30 tablet   1     BP 130/82  Pulse 90  Temp 98 F (36.7 C) (Oral)  Resp 20  Wt 245 lb (111.131 kg)  SpO2 98%  Physical Exam  Nursing note and vitals reviewed. Constitutional: He appears well-developed and well-nourished.  HENT:  Head: Normocephalic and atraumatic.  Eyes: Pupils are equal, round, and reactive to light.  Neck: Normal range of motion.  Cardiovascular: Normal rate.   Pulmonary/Chest: Effort normal.  Musculoskeletal:       Tender right elbow,  Decreased range of motion,  nv and ns intact  Neurological: He is alert.  Skin: Skin is warm.    ED Course  Procedures (including critical care  time)  Labs Reviewed - No data to display No results found.   No diagnosis found.    MDM  Pt given 2 percocet here for pain,   Pt advised ice, follow up with Dr. Pearletha Forge,   Rx for ibuprofen and hydrocodone.          Lonia Skinner [place], [place] 04/14/12 1302

## 2012-04-14 NOTE — ED Provider Notes (Signed)
Medical screening examination/treatment/procedure(s) were performed by non-physician practitioner and as supervising physician I was immediately available for consultation/collaboration.  [name], MD 04/14/12 [phone]

## 2012-04-14 NOTE — ED Notes (Signed)
Right shoulder and elbow pain thinks he hurt it lifting heavy sheet metal from car last night

## 2012-05-08 ENCOUNTER — Emergency Department (HOSPITAL_BASED_OUTPATIENT_CLINIC_OR_DEPARTMENT_OTHER)
Admission: EM | Admit: 2012-05-08 | Discharge: 2012-05-09 | Disposition: A | Discharge status: Home / Self Care | Payer: Medicare Other | Attending: Emergency Medicine | Admitting: Emergency Medicine

## 2012-05-08 ENCOUNTER — Encounter (HOSPITAL_BASED_OUTPATIENT_CLINIC_OR_DEPARTMENT_OTHER): Payer: Self-pay | Admitting: Emergency Medicine

## 2012-05-08 DIAGNOSIS — R0681 Apnea, not elsewhere classified: Secondary | ICD-10-CM | POA: Insufficient documentation

## 2012-05-08 DIAGNOSIS — R4789 Other speech disturbances: Secondary | ICD-10-CM | POA: Insufficient documentation

## 2012-05-08 DIAGNOSIS — M25519 Pain in unspecified shoulder: Secondary | ICD-10-CM | POA: Insufficient documentation

## 2012-05-08 DIAGNOSIS — I10 Essential (primary) hypertension: Secondary | ICD-10-CM | POA: Insufficient documentation

## 2012-05-08 DIAGNOSIS — Z8709 Personal history of other diseases of the respiratory system: Secondary | ICD-10-CM | POA: Insufficient documentation

## 2012-05-08 DIAGNOSIS — Z79899 Other long term (current) drug therapy: Secondary | ICD-10-CM | POA: Insufficient documentation

## 2012-05-08 DIAGNOSIS — Y9389 Activity, other specified: Secondary | ICD-10-CM | POA: Insufficient documentation

## 2012-05-08 DIAGNOSIS — R52 Pain, unspecified: Secondary | ICD-10-CM | POA: Insufficient documentation

## 2012-05-08 DIAGNOSIS — E119 Type 2 diabetes mellitus without complications: Secondary | ICD-10-CM | POA: Insufficient documentation

## 2012-05-08 DIAGNOSIS — Z8739 Personal history of other diseases of the musculoskeletal system and connective tissue: Secondary | ICD-10-CM | POA: Insufficient documentation

## 2012-05-08 DIAGNOSIS — J4489 Other specified chronic obstructive pulmonary disease: Secondary | ICD-10-CM | POA: Insufficient documentation

## 2012-05-08 DIAGNOSIS — J449 Chronic obstructive pulmonary disease, unspecified: Secondary | ICD-10-CM | POA: Insufficient documentation

## 2012-05-08 DIAGNOSIS — Z8659 Personal history of other mental and behavioral disorders: Secondary | ICD-10-CM | POA: Insufficient documentation

## 2012-05-08 DIAGNOSIS — F17200 Nicotine dependence, unspecified, uncomplicated: Secondary | ICD-10-CM | POA: Insufficient documentation

## 2012-05-08 DIAGNOSIS — E7800 Pure hypercholesterolemia, unspecified: Secondary | ICD-10-CM | POA: Insufficient documentation

## 2012-05-08 DIAGNOSIS — Y92410 Unspecified street and highway as the place of occurrence of the external cause: Secondary | ICD-10-CM | POA: Insufficient documentation

## 2012-05-08 DIAGNOSIS — Z794 Long term (current) use of insulin: Secondary | ICD-10-CM | POA: Insufficient documentation

## 2012-05-08 DIAGNOSIS — Z9861 Coronary angioplasty status: Secondary | ICD-10-CM | POA: Insufficient documentation

## 2012-05-08 DIAGNOSIS — E669 Obesity, unspecified: Secondary | ICD-10-CM | POA: Insufficient documentation

## 2012-05-08 HISTORY — DX: Other amnesia: R41.3

## 2012-05-08 HISTORY — DX: Unspecified injury of head, initial encounter: S09.90XA
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
## 2012-05-09 ENCOUNTER — Emergency Department (HOSPITAL_BASED_OUTPATIENT_CLINIC_OR_DEPARTMENT_OTHER): Payer: Medicare Other

## 2012-05-09 ENCOUNTER — Encounter (HOSPITAL_BASED_OUTPATIENT_CLINIC_OR_DEPARTMENT_OTHER): Payer: Self-pay | Admitting: Emergency Medicine

## 2012-05-09 LAB — URINALYSIS, ROUTINE W REFLEX MICROSCOPIC
Ketones, ur: 15 mg/dL — AB
Leukocytes, UA: NEGATIVE
Nitrite: NEGATIVE
Protein, ur: 30 mg/dL — AB
Urobilinogen, UA: 1 mg/dL (ref 0.0–1.0)

## 2012-05-09 LAB — URINE MICROSCOPIC-ADD ON

## 2012-05-09 LAB — RAPID URINE DRUG SCREEN, HOSP PERFORMED: Barbiturates: NOT DETECTED
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
## 2012-05-09 NOTE — ED Notes (Signed)
Medical records received from [facility] and pt was actually seen there tonight at 2100. Pt has already had CT scan done.

## 2012-05-09 NOTE — ED Notes (Signed)
Pt has no visible injuries. No bruising, abrasions, or swelling noted to any area of stated pain.

## 2012-05-09 NOTE — ED Notes (Signed)
Pt is very drowsy and falls asleep quickly. Pt appears sedated but states his last pain medication was 1930 last pm.
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
## 2012-05-09 NOTE — ED Notes (Signed)
In with Dr. Read Drivers for assessment. Upon entering room pt was sleeping and snoring. Pt arousable to voice. Pt now states he is concerned because he is having trouble remembering things. Pt also c/o right arm pain at this time. Pt is able to move right arm without difficulty.

## 2012-05-29 ENCOUNTER — Emergency Department (HOSPITAL_COMMUNITY): Payer: Medicare Other

## 2012-05-29 ENCOUNTER — Emergency Department (HOSPITAL_COMMUNITY)
Admission: EM | Admit: 2012-05-29 | Discharge: 2012-05-29 | Disposition: A | Discharge status: Home / Self Care | Payer: Medicare Other | Attending: Emergency Medicine | Admitting: Emergency Medicine

## 2012-05-29 ENCOUNTER — Encounter (HOSPITAL_COMMUNITY): Payer: Self-pay | Admitting: *Deleted

## 2012-05-29 DIAGNOSIS — W010XXA Fall on same level from slipping, tripping and stumbling without subsequent striking against object, initial encounter: Secondary | ICD-10-CM | POA: Insufficient documentation

## 2012-05-29 DIAGNOSIS — I10 Essential (primary) hypertension: Secondary | ICD-10-CM | POA: Insufficient documentation

## 2012-05-29 DIAGNOSIS — W19XXXA Unspecified fall, initial encounter: Secondary | ICD-10-CM

## 2012-05-29 DIAGNOSIS — S199XXA Unspecified injury of neck, initial encounter: Secondary | ICD-10-CM | POA: Insufficient documentation

## 2012-05-29 DIAGNOSIS — F17200 Nicotine dependence, unspecified, uncomplicated: Secondary | ICD-10-CM | POA: Insufficient documentation

## 2012-05-29 DIAGNOSIS — Z8739 Personal history of other diseases of the musculoskeletal system and connective tissue: Secondary | ICD-10-CM | POA: Insufficient documentation

## 2012-05-29 DIAGNOSIS — F319 Bipolar disorder, unspecified: Secondary | ICD-10-CM | POA: Insufficient documentation

## 2012-05-29 DIAGNOSIS — Z79899 Other long term (current) drug therapy: Secondary | ICD-10-CM | POA: Insufficient documentation

## 2012-05-29 DIAGNOSIS — Z8709 Personal history of other diseases of the respiratory system: Secondary | ICD-10-CM | POA: Insufficient documentation

## 2012-05-29 DIAGNOSIS — J4489 Other specified chronic obstructive pulmonary disease: Secondary | ICD-10-CM | POA: Insufficient documentation

## 2012-05-29 DIAGNOSIS — Y92009 Unspecified place in unspecified non-institutional (private) residence as the place of occurrence of the external cause: Secondary | ICD-10-CM | POA: Insufficient documentation

## 2012-05-29 DIAGNOSIS — S0993XA Unspecified injury of face, initial encounter: Secondary | ICD-10-CM | POA: Insufficient documentation

## 2012-05-29 DIAGNOSIS — S8990XA Unspecified injury of unspecified lower leg, initial encounter: Secondary | ICD-10-CM | POA: Insufficient documentation

## 2012-05-29 DIAGNOSIS — Z9889 Other specified postprocedural states: Secondary | ICD-10-CM | POA: Insufficient documentation

## 2012-05-29 DIAGNOSIS — Z8669 Personal history of other diseases of the nervous system and sense organs: Secondary | ICD-10-CM | POA: Insufficient documentation

## 2012-05-29 DIAGNOSIS — R4182 Altered mental status, unspecified: Secondary | ICD-10-CM | POA: Insufficient documentation

## 2012-05-29 DIAGNOSIS — E7800 Pure hypercholesterolemia, unspecified: Secondary | ICD-10-CM | POA: Insufficient documentation

## 2012-05-29 DIAGNOSIS — J449 Chronic obstructive pulmonary disease, unspecified: Secondary | ICD-10-CM | POA: Insufficient documentation

## 2012-05-29 DIAGNOSIS — Y9301 Activity, walking, marching and hiking: Secondary | ICD-10-CM | POA: Insufficient documentation

## 2012-05-29 DIAGNOSIS — Z87828 Personal history of other (healed) physical injury and trauma: Secondary | ICD-10-CM | POA: Insufficient documentation

## 2012-05-29 DIAGNOSIS — IMO0002 Reserved for concepts with insufficient information to code with codable children: Secondary | ICD-10-CM | POA: Insufficient documentation

## 2012-05-29 DIAGNOSIS — Z794 Long term (current) use of insulin: Secondary | ICD-10-CM | POA: Insufficient documentation

## 2012-05-29 DIAGNOSIS — E119 Type 2 diabetes mellitus without complications: Secondary | ICD-10-CM | POA: Insufficient documentation

## 2012-05-29 LAB — CBC
HCT: 42.4 % (ref 39.0–52.0)
Hemoglobin: 15.1 g/dL (ref 13.0–17.0)
MCHC: 35.6 g/dL (ref 30.0–36.0)
WBC: 7.6 10*3/uL (ref 4.0–10.5)

## 2012-05-29 LAB — GLUCOSE, CAPILLARY: Glucose-Capillary: 81 mg/dL (ref 70–99)

## 2012-05-29 LAB — RAPID URINE DRUG SCREEN, HOSP PERFORMED
Barbiturates: NOT DETECTED
Cocaine: NOT DETECTED
Tetrahydrocannabinol: NOT DETECTED

## 2012-05-29 LAB — POCT I-STAT, CHEM 8
BUN: 25 mg/dL — ABNORMAL HIGH (ref 6–23)
Calcium, Ion: 1.25 mmol/L — ABNORMAL HIGH (ref 1.12–1.23)
Chloride: 103 mEq/L (ref 96–112)
HCT: 44 % (ref 39.0–52.0)
Sodium: 138 mEq/L (ref 135–145)
TCO2: 26 mmol/L (ref 0–100)

## 2012-05-29 MED ORDER — KETOROLAC TROMETHAMINE 30 MG/ML IJ SOLN
30.0000 mg | Freq: Once | INTRAMUSCULAR | Status: AC
  Administered 2012-05-29: 30 mg via INTRAMUSCULAR
  Filled 2012-05-29: qty 1

## 2012-05-29 MED ORDER — HYDROCODONE-ACETAMINOPHEN 5-325 MG PO TABS
1.0000 | ORAL_TABLET | Freq: Once | ORAL | Status: AC
  Administered 2012-05-29: 1 via ORAL
  Filled 2012-05-29: qty 1

## 2012-05-29 MED ORDER — KETOROLAC TROMETHAMINE 10 MG PO TABS: 10.0000 mg | ORAL_TABLET | Freq: Four times a day (QID) | ORAL | Status: DC | PRN

## 2012-05-29 NOTE — Discharge Instructions (Signed)
Tonight your x rays are normal You  Have been given a prescription for Toradol that you can take on a regular basis with your other medications for pain control  Please maek an appointment with your PCP for further treatment as needed
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
## 2012-05-29 NOTE — ED Notes (Signed)
Patient transported to X-ray 

## 2012-05-29 NOTE — ED Notes (Signed)
Notified RN, Irekia CBG 81.

## 2012-05-29 NOTE — ED Notes (Signed)
Pt ambulated greater than 4ft without difficulty. Pt c/o back pain with ambulation but denied SOB, dizziness or other symptoms. Carlos Spry NP notified.

## 2012-05-30 NOTE — ED Provider Notes (Signed)
Medical screening examination/treatment/procedure(s) were performed by non-physician practitioner and as supervising physician I was immediately available for consultation/collaboration.   David Yelverton, MD 05/30/12 2319 

## 2012-09-09 ENCOUNTER — Emergency Department (HOSPITAL_BASED_OUTPATIENT_CLINIC_OR_DEPARTMENT_OTHER)
Admission: EM | Admit: 2012-09-09 | Discharge: 2012-09-10 | Disposition: A | Discharge status: Home / Self Care | Payer: Medicare Other | Attending: Emergency Medicine | Admitting: Emergency Medicine

## 2012-09-09 ENCOUNTER — Emergency Department (HOSPITAL_BASED_OUTPATIENT_CLINIC_OR_DEPARTMENT_OTHER): Payer: Medicare Other

## 2012-09-09 DIAGNOSIS — J449 Chronic obstructive pulmonary disease, unspecified: Secondary | ICD-10-CM | POA: Insufficient documentation

## 2012-09-09 DIAGNOSIS — E7800 Pure hypercholesterolemia, unspecified: Secondary | ICD-10-CM | POA: Insufficient documentation

## 2012-09-09 DIAGNOSIS — Z8709 Personal history of other diseases of the respiratory system: Secondary | ICD-10-CM | POA: Insufficient documentation

## 2012-09-09 DIAGNOSIS — F17200 Nicotine dependence, unspecified, uncomplicated: Secondary | ICD-10-CM | POA: Insufficient documentation

## 2012-09-09 DIAGNOSIS — G8929 Other chronic pain: Secondary | ICD-10-CM | POA: Insufficient documentation

## 2012-09-09 DIAGNOSIS — Z8669 Personal history of other diseases of the nervous system and sense organs: Secondary | ICD-10-CM | POA: Insufficient documentation

## 2012-09-09 DIAGNOSIS — I10 Essential (primary) hypertension: Secondary | ICD-10-CM | POA: Insufficient documentation

## 2012-09-09 DIAGNOSIS — E119 Type 2 diabetes mellitus without complications: Secondary | ICD-10-CM | POA: Insufficient documentation

## 2012-09-09 DIAGNOSIS — Z8659 Personal history of other mental and behavioral disorders: Secondary | ICD-10-CM | POA: Insufficient documentation

## 2012-09-09 DIAGNOSIS — S0993XA Unspecified injury of face, initial encounter: Secondary | ICD-10-CM | POA: Insufficient documentation

## 2012-09-09 DIAGNOSIS — J4489 Other specified chronic obstructive pulmonary disease: Secondary | ICD-10-CM | POA: Insufficient documentation

## 2012-09-09 DIAGNOSIS — S6000XA Contusion of unspecified finger without damage to nail, initial encounter: Secondary | ICD-10-CM

## 2012-09-09 DIAGNOSIS — S139XXA Sprain of joints and ligaments of unspecified parts of neck, initial encounter: Secondary | ICD-10-CM | POA: Insufficient documentation

## 2012-09-09 DIAGNOSIS — Z79899 Other long term (current) drug therapy: Secondary | ICD-10-CM | POA: Insufficient documentation

## 2012-09-09 DIAGNOSIS — Y9389 Activity, other specified: Secondary | ICD-10-CM | POA: Insufficient documentation

## 2012-09-09 DIAGNOSIS — S161XXA Strain of muscle, fascia and tendon at neck level, initial encounter: Secondary | ICD-10-CM

## 2012-09-09 DIAGNOSIS — Z794 Long term (current) use of insulin: Secondary | ICD-10-CM | POA: Insufficient documentation

## 2012-09-09 DIAGNOSIS — Y929 Unspecified place or not applicable: Secondary | ICD-10-CM | POA: Insufficient documentation

## 2012-09-09 DIAGNOSIS — F319 Bipolar disorder, unspecified: Secondary | ICD-10-CM | POA: Insufficient documentation

## 2012-09-09 DIAGNOSIS — IMO0002 Reserved for concepts with insufficient information to code with codable children: Secondary | ICD-10-CM | POA: Insufficient documentation

## 2012-09-09 DIAGNOSIS — W11XXXA Fall on and from ladder, initial encounter: Secondary | ICD-10-CM | POA: Insufficient documentation

## 2012-09-09 HISTORY — DX: Opioid abuse, uncomplicated: F11.10
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
## 2012-09-10 ENCOUNTER — Encounter (HOSPITAL_BASED_OUTPATIENT_CLINIC_OR_DEPARTMENT_OTHER): Payer: Self-pay | Admitting: Emergency Medicine

## 2012-09-10 NOTE — ED Notes (Signed)
MD at bedside. 
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
## 2012-10-20 ENCOUNTER — Telehealth (HOSPITAL_COMMUNITY): Payer: Self-pay

## 2012-10-20 ENCOUNTER — Encounter (HOSPITAL_COMMUNITY): Payer: Self-pay | Admitting: Psychiatry

## 2012-10-20 ENCOUNTER — Ambulatory Visit (INDEPENDENT_AMBULATORY_CARE_PROVIDER_SITE_OTHER): Payer: Medicare Other | Admitting: Psychiatry

## 2012-10-20 VITALS — BP 119/79 | HR 107 | Ht 66.0 in | Wt 273.0 lb

## 2012-10-20 DIAGNOSIS — F1310 Sedative, hypnotic or anxiolytic abuse, uncomplicated: Secondary | ICD-10-CM

## 2012-10-20 DIAGNOSIS — F3181 Bipolar II disorder: Secondary | ICD-10-CM

## 2012-10-20 DIAGNOSIS — F1010 Alcohol abuse, uncomplicated: Secondary | ICD-10-CM

## 2012-10-20 DIAGNOSIS — F3189 Other bipolar disorder: Secondary | ICD-10-CM

## 2012-10-20 DIAGNOSIS — F1210 Cannabis abuse, uncomplicated: Secondary | ICD-10-CM
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
## 2012-10-21 ENCOUNTER — Emergency Department (HOSPITAL_BASED_OUTPATIENT_CLINIC_OR_DEPARTMENT_OTHER): Payer: Medicare Other

## 2012-10-21 ENCOUNTER — Emergency Department (HOSPITAL_BASED_OUTPATIENT_CLINIC_OR_DEPARTMENT_OTHER)
Admission: EM | Admit: 2012-10-21 | Discharge: 2012-10-21 | Disposition: A | Discharge status: Home / Self Care | Payer: Medicare Other | Attending: Emergency Medicine | Admitting: Emergency Medicine

## 2012-10-21 ENCOUNTER — Encounter (HOSPITAL_BASED_OUTPATIENT_CLINIC_OR_DEPARTMENT_OTHER): Payer: Self-pay | Admitting: Emergency Medicine

## 2012-10-21 DIAGNOSIS — S298XXA Other specified injuries of thorax, initial encounter: Secondary | ICD-10-CM | POA: Insufficient documentation

## 2012-10-21 DIAGNOSIS — I10 Essential (primary) hypertension: Secondary | ICD-10-CM | POA: Insufficient documentation

## 2012-10-21 DIAGNOSIS — E119 Type 2 diabetes mellitus without complications: Secondary | ICD-10-CM | POA: Insufficient documentation

## 2012-10-21 DIAGNOSIS — W010XXA Fall on same level from slipping, tripping and stumbling without subsequent striking against object, initial encounter: Secondary | ICD-10-CM | POA: Insufficient documentation

## 2012-10-21 DIAGNOSIS — Z8669 Personal history of other diseases of the nervous system and sense organs: Secondary | ICD-10-CM | POA: Insufficient documentation

## 2012-10-21 DIAGNOSIS — Z888 Allergy status to other drugs, medicaments and biological substances status: Secondary | ICD-10-CM | POA: Insufficient documentation

## 2012-10-21 DIAGNOSIS — S46909A Unspecified injury of unspecified muscle, fascia and tendon at shoulder and upper arm level, unspecified arm, initial encounter: Secondary | ICD-10-CM | POA: Insufficient documentation

## 2012-10-21 DIAGNOSIS — R062 Wheezing: Secondary | ICD-10-CM | POA: Insufficient documentation

## 2012-10-21 DIAGNOSIS — J4489 Other specified chronic obstructive pulmonary disease: Secondary | ICD-10-CM | POA: Insufficient documentation

## 2012-10-21 DIAGNOSIS — E7800 Pure hypercholesterolemia, unspecified: Secondary | ICD-10-CM | POA: Insufficient documentation

## 2012-10-21 DIAGNOSIS — F319 Bipolar disorder, unspecified: Secondary | ICD-10-CM | POA: Insufficient documentation

## 2012-10-21 DIAGNOSIS — Z87828 Personal history of other (healed) physical injury and trauma: Secondary | ICD-10-CM | POA: Insufficient documentation

## 2012-10-21 DIAGNOSIS — R21 Rash and other nonspecific skin eruption: Secondary | ICD-10-CM | POA: Insufficient documentation

## 2012-10-21 DIAGNOSIS — Z8709 Personal history of other diseases of the respiratory system: Secondary | ICD-10-CM | POA: Insufficient documentation

## 2012-10-21 DIAGNOSIS — Y9302 Activity, running: Secondary | ICD-10-CM | POA: Insufficient documentation

## 2012-10-21 DIAGNOSIS — R000 Tachycardia, unspecified: Secondary | ICD-10-CM | POA: Insufficient documentation

## 2012-10-21 DIAGNOSIS — Z79899 Other long term (current) drug therapy: Secondary | ICD-10-CM | POA: Insufficient documentation

## 2012-10-21 DIAGNOSIS — T63461A Toxic effect of venom of wasps, accidental (unintentional), initial encounter: Secondary | ICD-10-CM | POA: Insufficient documentation

## 2012-10-21 DIAGNOSIS — Z792 Long term (current) use of antibiotics: Secondary | ICD-10-CM | POA: Insufficient documentation

## 2012-10-21 DIAGNOSIS — S4980XA Other specified injuries of shoulder and upper arm, unspecified arm, initial encounter: Secondary | ICD-10-CM | POA: Insufficient documentation

## 2012-10-21 DIAGNOSIS — Y929 Unspecified place or not applicable: Secondary | ICD-10-CM | POA: Insufficient documentation

## 2012-10-21 DIAGNOSIS — J449 Chronic obstructive pulmonary disease, unspecified: Secondary | ICD-10-CM | POA: Insufficient documentation

## 2012-10-21 DIAGNOSIS — F17200 Nicotine dependence, unspecified, uncomplicated: Secondary | ICD-10-CM | POA: Insufficient documentation

## 2012-10-21 DIAGNOSIS — T6391XA Toxic effect of contact with unspecified venomous animal, accidental (unintentional), initial encounter: Secondary | ICD-10-CM | POA: Insufficient documentation

## 2012-10-21 MED ORDER — HYDROCODONE-ACETAMINOPHEN 5-325 MG PO TABS
2.0000 | ORAL_TABLET | Freq: Once | ORAL | Status: AC
  Administered 2012-10-21: 2 via ORAL
  Filled 2012-10-21: qty 2

## 2012-10-21 MED ORDER — DIPHENHYDRAMINE HCL 25 MG PO TABS: 50.0000 mg | ORAL_TABLET | ORAL | Status: DC | PRN

## 2012-10-21 MED ORDER — ALBUTEROL SULFATE (5 MG/ML) 0.5% IN NEBU
2.5000 mg | INHALATION_SOLUTION | Freq: Once | RESPIRATORY_TRACT | Status: AC
  Administered 2012-10-21: 2.5 mg via RESPIRATORY_TRACT

## 2012-10-21 MED ORDER — PREDNISONE 50 MG PO TABS
60.0000 mg | ORAL_TABLET | Freq: Once | ORAL | Status: AC
  Administered 2012-10-21: 60 mg via ORAL
  Filled 2012-10-21: qty 1

## 2012-10-21 MED ORDER — ALBUTEROL SULFATE (5 MG/ML) 0.5% IN NEBU
2.5000 mg | INHALATION_SOLUTION | RESPIRATORY_TRACT | Status: DC
  Filled 2012-10-21: qty 0.5

## 2012-10-21 MED ORDER — EPINEPHRINE 0.3 MG/0.3ML IJ SOAJ: 0.3000 mg | INTRAMUSCULAR | Status: DC | PRN

## 2012-10-21 MED ORDER — IPRATROPIUM BROMIDE 0.02 % IN SOLN
0.5000 mg | RESPIRATORY_TRACT | Status: DC
  Filled 2012-10-21: qty 2.5

## 2012-10-21 MED ORDER — IPRATROPIUM BROMIDE 0.02 % IN SOLN
0.5000 mg | Freq: Once | RESPIRATORY_TRACT | Status: AC
  Administered 2012-10-21: 0.5 mg via RESPIRATORY_TRACT

## 2012-10-21 MED ORDER — DIPHENHYDRAMINE HCL 25 MG PO CAPS
50.0000 mg | ORAL_CAPSULE | Freq: Once | ORAL | Status: AC
  Administered 2012-10-21: 50 mg via ORAL
  Filled 2012-10-21: qty 2

## 2012-10-21 MED ORDER — RANITIDINE HCL 150 MG/10ML PO SYRP
150.0000 mg | ORAL_SOLUTION | Freq: Once | ORAL | Status: AC
  Administered 2012-10-21: 150 mg via ORAL
  Filled 2012-10-21: qty 10

## 2012-10-21 MED ORDER — RANITIDINE HCL 150 MG PO TABS: 150.0000 mg | ORAL_TABLET | Freq: Two times a day (BID) | ORAL | Status: DC

## 2012-10-21 MED ORDER — PREDNISONE 20 MG PO TABS: ORAL_TABLET | ORAL | Status: AC

## 2012-10-21 MED ORDER — EPINEPHRINE 0.3 MG/0.3ML IJ SOAJ
0.3000 mg | Freq: Once | INTRAMUSCULAR | Status: AC
  Administered 2012-10-21: 0.3 mg via INTRAMUSCULAR
  Filled 2012-10-21: qty 0.3

## 2012-10-21 NOTE — ED Notes (Signed)
Pt stung by bees multiple times. Pt ran and fell. C/o left knee and left ankle pain. Pt c/o itching all over. Pt took 3-4 benadryl PTA

## 2012-10-21 NOTE — ED Notes (Signed)
Pt having no respiratory difficulty

## 2012-10-21 NOTE — Telephone Encounter (Signed)
First Attempt to call patient back. Left message stating I would call back.

## 2012-10-22 ENCOUNTER — Encounter (HOSPITAL_COMMUNITY): Payer: Self-pay | Admitting: Psychiatry

## 2012-10-23 NOTE — Telephone Encounter (Signed)
Second attempt to call patient back. Left message on listed number.

## 2012-10-26 ENCOUNTER — Telehealth (HOSPITAL_COMMUNITY): Payer: Self-pay

## 2012-10-26 MED ORDER — CLONAZEPAM 1 MG PO TABS: 1.0000 mg | ORAL_TABLET | Freq: Three times a day (TID) | ORAL | Status: AC

## 2012-10-26 NOTE — Telephone Encounter (Signed)
Advised patient's family  how to taper clonazepam and to go to the ER with any withdrawal symptoms. Carlos Porter

## 2012-10-26 NOTE — Telephone Encounter (Signed)
WIFE WOULD LIKE TO SPEAK TO YOU ABOUT THE CLONAZEPAM.

## 2012-10-26 NOTE — ED Provider Notes (Signed)
CSN: [id number]     Arrival date & time 10/21/12  1911 History     First MD Initiated Contact with Patient 10/21/12 2015     Chief Complaint  Patient presents with  . Insect Bite  . Leg Injury   (Consider location/radiation/quality/duration/timing/severity/associated sxs/prior Treatment) Patient is a 37 y.o. male presenting with animal bite. The history is provided by the patient.  Animal Bite Contact animal:  Insect Location:  Shoulder/arm and torso Shoulder/arm injury location:  L shoulder and R shoulder Torso injury location:  L chest and R chest Pain details:    Quality:  Sharp   Severity:  Moderate   Timing:  Constant   Progression:  Unchanged Incident location:  Outside Provoked: unprovoked   Notifications:  None Animal in possession: no   Relieved by:  Nothing Worsened by:  Nothing tried Associated symptoms: no fever     Past Medical History  Diagnosis Date  . Diabetes mellitus   . Apnea   . Hypertension   . High cholesterol   . Lung disease   . COPD (chronic obstructive pulmonary disease)   . Carpal tunnel syndrome on both sides 2011    cannot affort splints to wear at night  . Marijuana abuse 06/07/2011  . Alcohol abuse 06/07/2011  . Benzodiazepine abuse 06/07/2011  . Obesity, morbid   . Bipolar disorder   . Memory dysfunction following head trauma   . Opiate abuse, continuous    Past Surgical History  Procedure Laterality Date  . Lung biopsy  2009  . Cardiac catheterization    . Dental surgery    . Root canal     No family history on file. History  Substance Use Topics  . Smoking status: Current Every Day Smoker -- 0.50 packs/day for 24 years    Types: Cigarettes  . Smokeless tobacco: Never Used     Comment: Used to smoke 3-4 ppd Declines help  . Alcohol Use: Yes     Comment: .    Review of Systems  Constitutional: Negative for fever.  Respiratory: Positive for wheezing. Negative for cough and shortness of breath.   All other systems  reviewed and are negative.    Allergies  Darvocet; Darvon; Keflex; and Morphine and related  Home Medications   Current Outpatient Rx  Name  Route  Sig  Dispense  Refill  . albuterol (PROVENTIL HFA;VENTOLIN HFA) 108 (90 BASE) MCG/ACT inhaler   Inhalation   Inhale 2 puffs into the lungs as needed for wheezing. Use as directed.         Marland Kitchen atorvastatin (LIPITOR) 40 MG tablet   Oral   Take 40 mg by mouth daily.         . budesonide-formoterol (SYMBICORT) 80-4.5 MCG/ACT inhaler   Inhalation   Inhale 2 puffs into the lungs 2 (two) times daily.         . busPIRone (BUSPAR) 15 MG tablet               . carvedilol (COREG) 3.125 MG tablet   Oral   Take 3.125 mg by mouth 2 (two) times daily with a meal.         . chlorhexidine (PERIDEX) 0.12 % solution   Mouth/Throat   Use as directed 15 mLs in the mouth or throat 2 (two) times daily.   120 mL   0   . clonazePAM (KLONOPIN) 1 MG tablet   Oral   Take 1 tablet (1 mg total) by mouth  3 (three) times daily. Take one and half tablets (1.5 mg) at 8 PM; then take one tablet at 8 AM and 8 PM tomorrow;  then one tablet at 8 AM, and a half tablet at 8 PM on day three;  then one-half tablet at 8 AM and 8 PM on day 4; then one-half tablet on day 5, then stop. Go to ER with any withdrawal symptoms or  seizures.   6 tablet   0   . clotrimazole (LOTRIMIN) 1 % cream               . diphenhydrAMINE (BENADRYL) 25 MG tablet   Oral   Take 2 tablets (50 mg total) by mouth every 4 (four) hours as needed for itching.   20 tablet   0   . EPINEPHrine (EPIPEN) 0.3 mg/0.3 mL SOAJ injection   Intramuscular   Inject 0.3 mLs (0.3 mg total) into the muscle as needed (for shortness of breath, trouble breathing after a bee sting).   2 Device   0   . gabapentin (NEURONTIN) 600 MG tablet   Oral   Take 1,200 mg by mouth 4 (four) times daily.         Marland Kitchen HYDROcodone-acetaminophen (NORCO/VICODIN) 5-325 MG per tablet   Oral   Take 2 tablets  by mouth every 4 (four) hours as needed for pain.   16 tablet   0   . HYDROcodone-acetaminophen (VICODIN) 5-500 MG per tablet   Oral   Take 1-2 tablets by mouth every 6 (six) hours as needed for pain.   15 tablet   0   . ibuprofen (ADVIL,MOTRIN) 200 MG tablet   Oral   Take 800 mg by mouth every 6 (six) hours as needed. Patient took this medication for pain.         Marland Kitchen ibuprofen (ADVIL,MOTRIN) 800 MG tablet   Oral   Take 1 tablet (800 mg total) by mouth 3 (three) times daily.   21 tablet   0   . insulin glargine (LANTUS) 100 UNIT/ML injection   Subcutaneous   Inject 60 Units into the skin 2 (two) times daily.         Marland Kitchen ketorolac (TORADOL) 10 MG tablet   Oral   Take 1 tablet (10 mg total) by mouth every 6 (six) hours as needed for pain.   20 tablet   0   . levETIRAcetam (KEPPRA) 750 MG tablet               . levofloxacin (LEVAQUIN) 750 MG tablet               . lisinopril (PRINIVIL,ZESTRIL) 40 MG tablet   Oral   Take 40 mg by mouth daily.         . metFORMIN (GLUCOPHAGE) 500 MG tablet   Oral   Take 500 mg by mouth 2 (two) times daily with a meal.         . naproxen (NAPROSYN) 500 MG tablet               . naproxen sodium (ANAPROX) 220 MG tablet   Oral   Take 440 mg by mouth 2 (two) times daily with a meal. Patient took this medication for pain.         Marland Kitchen omega-3 acid ethyl esters (LOVAZA) 1 G capsule   Oral   Take 2 g by mouth 2 (two) times daily.         . predniSONE (DELTASONE)  20 MG tablet      2 tabs po daily x 4 days   8 tablet   0   . ranitidine (ZANTAC) 150 MG tablet   Oral   Take 1 tablet (150 mg total) by mouth 2 (two) times daily.   10 tablet   0   . sertraline (ZOLOFT) 100 MG tablet   Oral   Take 200 mg by mouth daily.         Marland Kitchen EXPIRED: traZODone (DESYREL) 150 MG tablet   Oral   Take 1 tablet (150 mg total) by mouth at bedtime.   30 tablet   1   . UNIFINE PENTIPS 31G X 8 MM MISC                BP  147/83  Pulse 111  Temp(Src) 98 F (36.7 C) (Oral)  Resp 22  SpO2 98% Physical Exam  Nursing note and vitals reviewed. Constitutional: He is oriented to person, place, and time. He appears well-developed and well-nourished. No distress.  HENT:  Head: Normocephalic and atraumatic.  Mouth/Throat: No oropharyngeal exudate.  Eyes: EOM are normal. Pupils are equal, round, and reactive to light.  Neck: Normal range of motion. Neck supple.  Cardiovascular: Normal rate and regular rhythm.  Exam reveals no friction rub.   No murmur heard. Pulmonary/Chest: Effort normal and breath sounds normal. No respiratory distress. He has no wheezes. He has no rales.  Abdominal: He exhibits no distension. There is no tenderness. There is no rebound.  Musculoskeletal: Normal range of motion. He exhibits no edema.  Neurological: He is alert and oriented to person, place, and time.  Skin: He is not diaphoretic.  Multiple sting wounds on upper extremities. Mild urticaria on upper extremities, upper chest, upper back.    ED Course   Procedures (including critical care time)  Labs Reviewed - No data to display No results found. 1. Allergic reaction to bee sting, initial encounter     MDM  37 year old male presents with bee stings. Patient received a present 10-15 bee stings on his chest and upper arms. He was running and tripped and hurt his left knee and left ankle. On arrival, patient has mild tachycardia, otherwise vitals stable. Patient on exam has multiple stainless and mild rash on upper chest upper back and upper arms. On exam he has mild wheezing. With mild wheezing, tachycardia, bee stings, concern for mild anaphylaxis. Epi given. Also given breathing she refers wheezes. X-rays of his lower sugar normal. On reexam, patient's has resolved his wheezing. He is feeling better. I gave him by mouth steroids and prescription for same. Stable for discharge.   Dagmar Hait, MD 10/26/12 [phone]

## 2012-10-28 ENCOUNTER — Ambulatory Visit (HOSPITAL_COMMUNITY): Payer: Self-pay | Admitting: Psychiatry

## 2012-10-29 ENCOUNTER — Ambulatory Visit (HOSPITAL_COMMUNITY): Payer: Self-pay | Admitting: Psychiatry

## 2013-03-05 ENCOUNTER — Encounter (HOSPITAL_COMMUNITY): Payer: Self-pay | Admitting: Psychiatry

## 2014-07-01 IMAGING — CR DG ANKLE COMPLETE 3+V*R*
3 series · 3 of 3 positions shown · non-contrast
Comparison: None.

CLINICAL DATA: Ankle pain

RIGHT ANKLE - COMPLETE 3+ VIEW

[t ankle joint ap right]
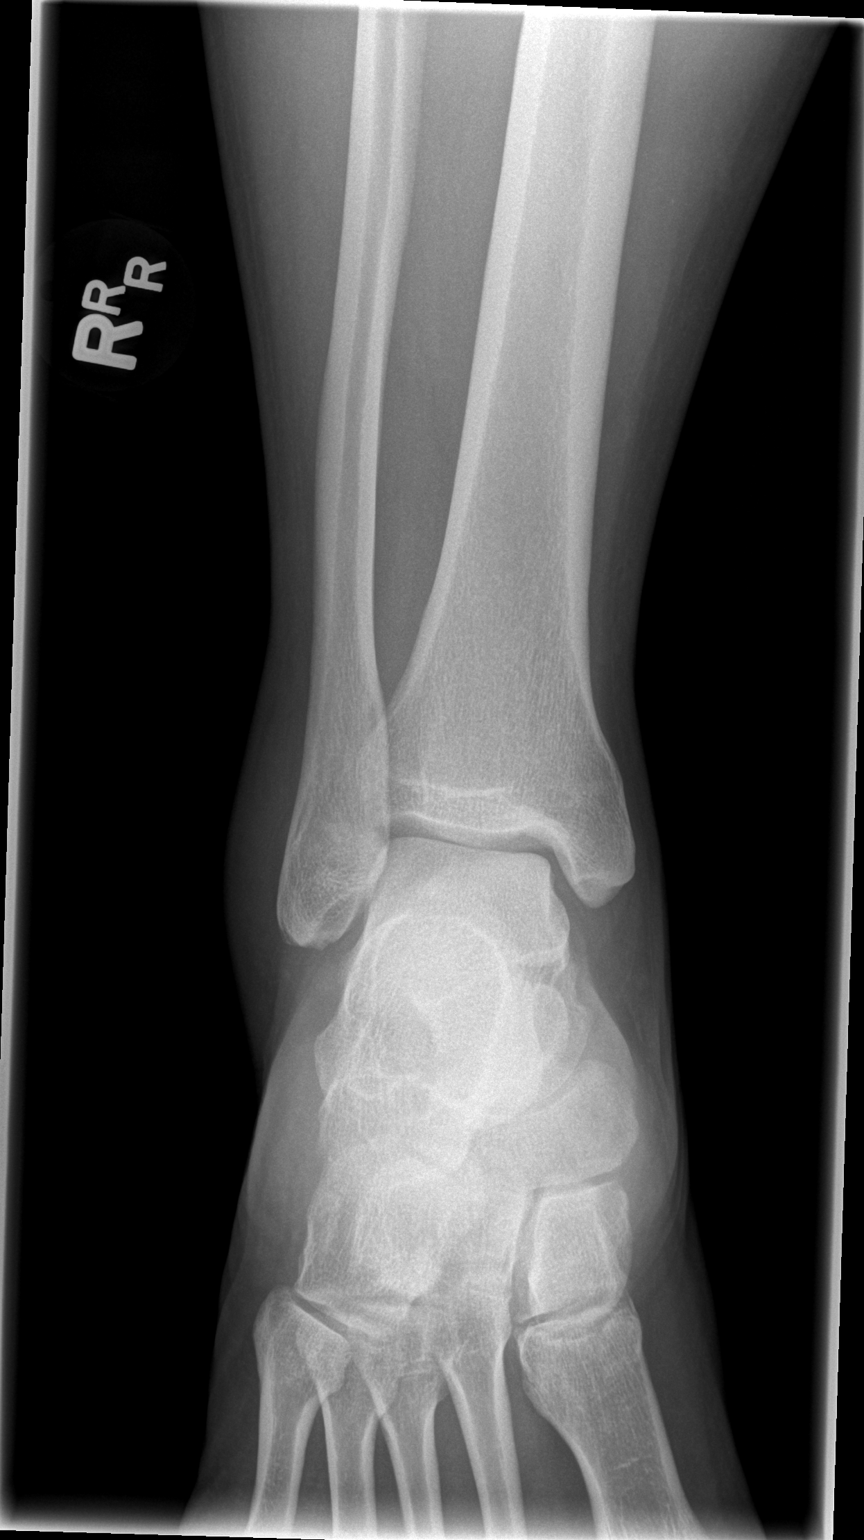

[t ankle joint oblique right]
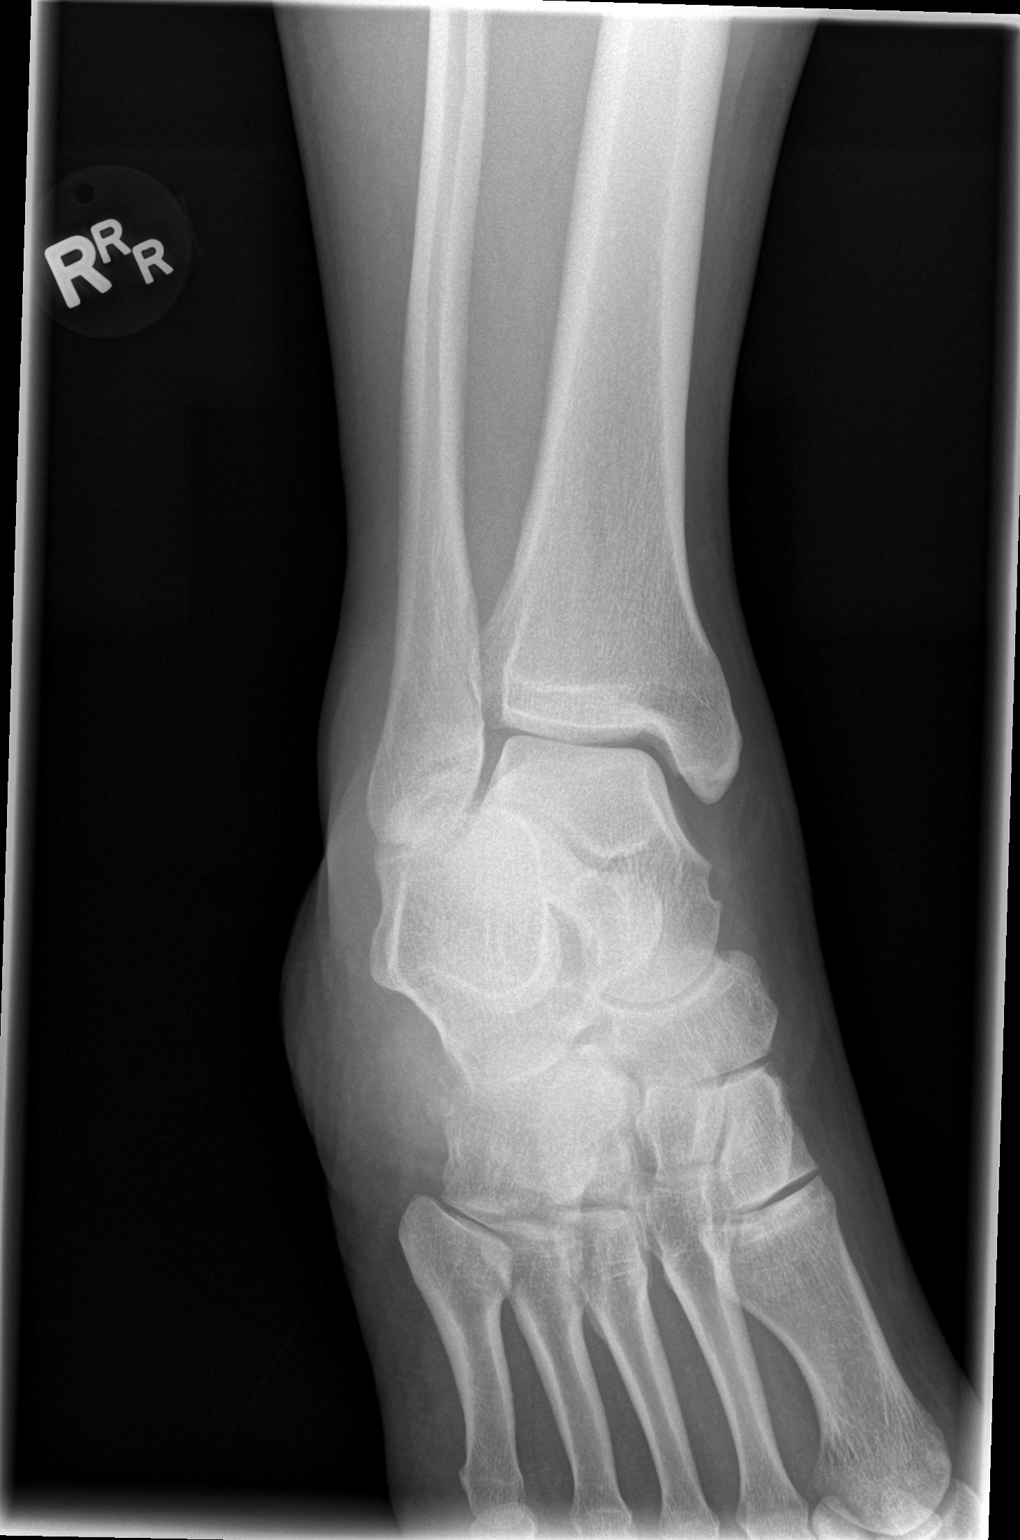

[t ankle joint lat right]
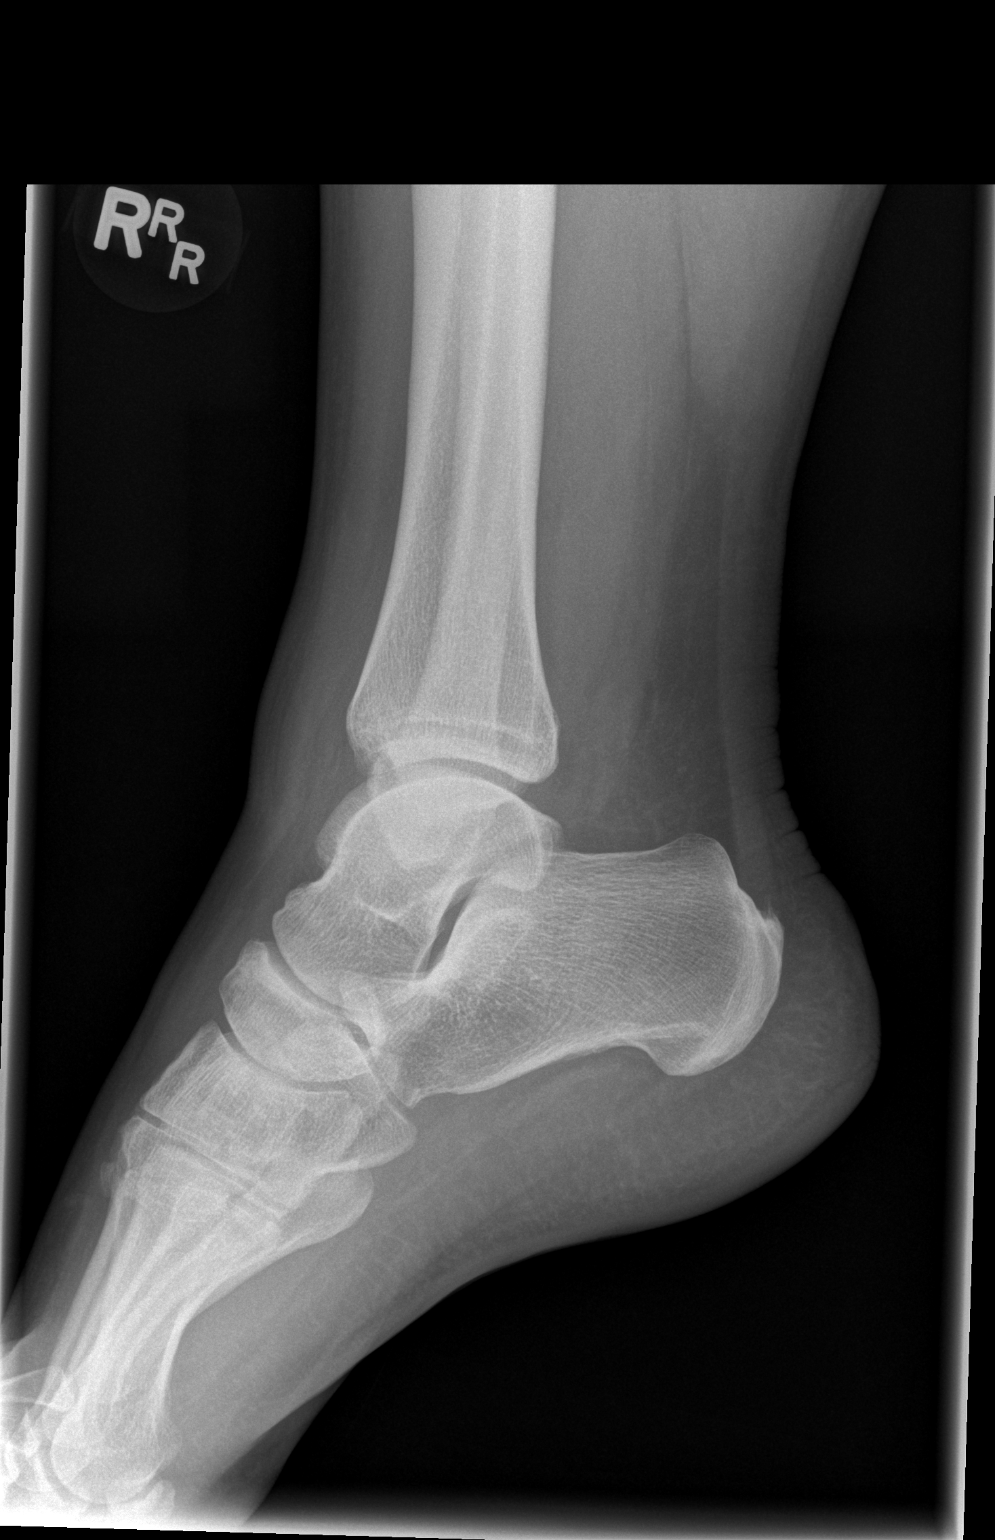

[3 of 3 positions shown; findings below may reference images not displayed]

FINDINGS: Three views of the right ankle submitted.  No acute
fracture or subluxation.  Ankle mortise is preserved.  Soft tissue
swelling noted adjacent to lateral malleolus.
IMPRESSION: No acute fracture or subluxation.  Soft tissue swelling adjacent to
lateral malleolus.

## 2020-08-23 ENCOUNTER — Emergency Department (INDEPENDENT_AMBULATORY_CARE_PROVIDER_SITE_OTHER)
Admission: EM | Admit: 2020-08-23 | Discharge: 2020-08-23 | Disposition: A | Discharge status: Home / Self Care | Payer: Medicare HMO | Source: Home / Self Care

## 2020-08-23 ENCOUNTER — Encounter: Payer: Self-pay | Admitting: Emergency Medicine

## 2020-08-23 DIAGNOSIS — Z23 Encounter for immunization: Secondary | ICD-10-CM

## 2020-08-23 DIAGNOSIS — S61214A Laceration without foreign body of right ring finger without damage to nail, initial encounter: Secondary | ICD-10-CM

## 2020-08-23 MED ORDER — BACITRACIN ZINC 500 UNIT/GM EX OINT: 1.0000 "application " | TOPICAL_OINTMENT | Freq: Two times a day (BID) | CUTANEOUS | 0 refills | Status: AC

## 2020-08-23 MED ORDER — TETANUS-DIPHTH-ACELL PERTUSSIS 5-2.5-18.5 LF-MCG/0.5 IM SUSY
0.5000 mL | PREFILLED_SYRINGE | Freq: Once | INTRAMUSCULAR | Status: AC
  Administered 2020-08-23: 0.5 mL via INTRAMUSCULAR

## 2020-08-23 NOTE — ED Provider Notes (Signed)
[facility]    CSN: [id number] Arrival date & time: 08/23/20  1409      History   Chief Complaint Chief Complaint  Patient presents with  . Laceration    HPI Carlos Porter is a 45 y.o. male comes to the urgent care with laceration of the right ring finger.  Patient sustained a laceration with a glass was doing some work at home.  Bleeding is controlled.  Patient does not remember when the last Tdap was given.  Patient is diabetic. HPI  Past Medical History:  Diagnosis Date  . Alcohol abuse 06/07/2011  . Apnea   . Benzodiazepine abuse (HCC) 06/07/2011  . Bipolar disorder (HCC)   . Carpal tunnel syndrome on both sides 2011   cannot affort splints to wear at night  . COPD (chronic obstructive pulmonary disease) (HCC)   . Diabetes mellitus   . High cholesterol   . Hypertension   . Lung disease   . Marijuana abuse 06/07/2011  . Memory dysfunction following head trauma   . Obesity, morbid (HCC)   . Opiate abuse, continuous [facility])     Patient Active Problem List   Diagnosis Date Noted  . Marijuana abuse 06/07/2011  . Alcohol abuse 06/07/2011  . Benzodiazepine abuse (HCC) 06/07/2011  . 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 CARDIAC CATHETERIZATION    . DENTAL SURGERY    . LUNG BIOPSY  2009  . ROOT CANAL         Home Medications    Prior to Admission medications   Medication Sig Start Date End Date Taking? Authorizing Provider  atorvastatin (LIPITOR) 40 MG tablet Take 40 mg by mouth daily.   Yes [provider]  bacitracin ointment Apply 1 application topically 2 (two) times daily. 08/23/20  Yes Nakeia Calvi, Britta Mccreedy, MD  budesonide-formoterol [facility]) 80-4.5 MCG/ACT inhaler Inhale 2 puffs into the lungs 2 (two) times daily.   Yes [provider]  carvedilol (COREG) 3.125 MG tablet Take 3.125 mg by mouth 2 (two) times daily with a meal.    Yes [provider]  insulin glargine (LANTUS) 100 UNIT/ML injection Inject 60 Units into the skin 2 (two) times daily.   Yes [provider]  lisinopril (PRINIVIL,ZESTRIL) 40 MG tablet Take 40 mg by mouth daily.   Yes [provider]  metFORMIN (GLUCOPHAGE) 500 MG tablet Take 500 mg by mouth 2 (two) times daily with a meal.   Yes [provider]  sertraline (ZOLOFT) 100 MG tablet Take 200 mg by mouth daily.   Yes [provider]  UNIFINE PENTIPS 31G X 8 MM MISC  08/17/12  Yes [provider]  levETIRAcetam (KEPPRA) 750 MG tablet  07/16/12   [provider]  predniSONE (DELTASONE) 20 MG tablet 2 tabs po daily x 4 days 10/21/12   Elwin Mocha, MD  traZODone (DESYREL) 150 MG tablet Take 1 tablet (150 mg total) by mouth at bedtime. 05/10/11 06/09/11  Mickeal Skinner, MD  albuterol (PROVENTIL HFA;VENTOLIN HFA) 108 (90 BASE) MCG/ACT inhaler Inhale 2 puffs into the lungs as needed for wheezing. Use as directed.  08/23/20  [provider]  diphenhydrAMINE (BENADRYL) 25 MG tablet Take 2 tablets (50 mg total) by mouth every 4 (four) hours as needed for itching. 10/21/12 08/23/20  Elwin Mocha, MD  omega-3 acid ethyl esters (LOVAZA) 1 G capsule Take 2 g by  mouth 2 (two) times daily.  08/23/20  [provider]  ranitidine (ZANTAC) 150 MG tablet Take 1 tablet (150 mg total) by mouth 2 (two) times daily. 10/21/12 08/23/20  Elwin Mocha, MD  topiramate (TOPAMAX) 25 MG tablet Take 2 tablets (50 mg total) by mouth 2 (two) times daily. 05/10/11 06/05/11  Mickeal Skinner, MD    Family History No family history on file.  Social History Social History   Tobacco Use  . Smoking status: Current Every Day Smoker    Packs/day: 0.50    Years: 24.00    Pack years: 12.00    Types: Cigarettes  . Smokeless tobacco: Never Used  . Tobacco comment: Used to smoke 3-4 ppd Declines help  Substance Use Topics  . Alcohol use: Yes    Comment: .  Marland Kitchen Drug use: Yes     Comment: hx 2001-2003 daily crack cocaine usage     Allergies   Darvocet [propoxyphene n-acetaminophen], Darvon, Keflex [cephalexin], and Morphine and related   Review of Systems Review of Systems  Gastrointestinal: Negative.   Skin: Positive for wound. Negative for color change and pallor.     Physical Exam Triage Vital Signs ED Triage Vitals [08/23/20 1428]  Enc Vitals Group     BP 106/69     Pulse Rate 94     Resp      Temp 97.9 F (36.6 C)     Temp Source Oral     SpO2 98 %     Weight      Height      Head Circumference      Peak Flow      Pain Score 10     Pain Loc      Pain Edu?      Excl. in GC?    No data found.  Updated Vital Signs BP 106/69 (BP Location: Right Arm)   Pulse 94   Temp 97.9 F (36.6 C) (Oral)   SpO2 98%   Visual Acuity Right Eye Distance:   Left Eye Distance:   Bilateral Distance:    Right Eye Near:   Left Eye Near:    Bilateral Near:     Physical Exam Vitals and nursing note reviewed.  Constitutional:      General: He is not in acute distress.    Appearance: He is not ill-appearing.  Cardiovascular:     Rate and Rhythm: Normal rate and regular rhythm.  Musculoskeletal:        General: Normal range of motion.  Skin:    Comments: Laceration over the proximal interphalangeal joint of the right ring finger.  Joint capsule is not exposed.  Full range of motion.  No numbness or tingling.  No weakness in the finger.  Neurological:     Mental Status: He is alert.      UC Treatments / Results  Labs (all labs ordered are listed, but only abnormal results are displayed) Labs Reviewed - No data to display  EKG   Radiology No results found.  Procedures Laceration Repair  Date/Time: 08/23/2020 2:58 PM Performed by: Merrilee Jansky, MD Authorized by: Merrilee Jansky, MD   Consent:    Consent obtained:  Verbal   Consent given by:  Patient   Risks discussed:  Infection and pain Universal protocol:    Patient  identity confirmed:  Verbally with patient Anesthesia:    Anesthesia method:  Local infiltration   Local anesthetic:  Bupivacaine 0.25% w/o epi Laceration details:  Location:  Finger   Finger location:  R ring finger   Length (cm):  2.5   Depth (mm):  5 Pre-procedure details:    Preparation:  Patient was prepped and draped in usual sterile fashion Exploration:    Wound exploration: wound explored through full range of motion     Wound extent: no tendon damage noted   Treatment:    Area cleansed with:  Povidone-iodine and Shur-Clens   Amount of cleaning:  Standard   Debridement:  None Skin repair:    Repair method:  Sutures   Suture size:  5-0   Suture technique:  Simple interrupted Approximation:    Approximation:  Close Post-procedure details:    Dressing:  Antibiotic ointment and non-adherent dressing   Procedure completion:  Tolerated well, no immediate complications   (including critical care time)  Medications Ordered in UC Medications  Tdap (BOOSTRIX) injection 0.5 mL (0.5 mLs Intramuscular Given 08/23/20 1452)    Initial Impression / Assessment and Plan / UC Course  I have reviewed the triage vital signs and the nursing notes.  Pertinent labs & imaging results that were available during my care of the patient were reviewed by me and considered in my medical decision making (see chart for details).     1.  Laceration of the right ring finger: Laceration repair completed Bacitracin ointment Daily wound dressing changes Return to urgent care for suture removal in 7 to 10 days. Return precautions given. Final Clinical Impressions(s) / UC Diagnoses   Final diagnoses:  Laceration of right ring finger without foreign body without damage to nail, initial encounter     Discharge Instructions     The wound dressing changes Okay to clean with mild soap and water after 48 hours of dressing changes Avoid placing a hand in water for prolonged periods of time If  you experience any worsening pain, erythema or discharge please return to urgent care to be reevaluated.   ED Prescriptions    Medication Sig Dispense Auth. Provider   bacitracin ointment Apply 1 application topically 2 (two) times daily. 120 g Jamol Ginyard, Britta Mccreedy, MD     PDMP not reviewed this encounter.   Merrilee Jansky, MD 08/23/20 1500

## 2020-08-23 NOTE — Discharge Instructions (Addendum)
The wound dressing changes Okay to clean with mild soap and water after 48 hours of dressing changes Avoid placing a hand in water for prolonged periods of time If you experience any worsening pain, erythema or discharge please return to urgent care to be reevaluated.

## 2020-08-23 NOTE — ED Triage Notes (Signed)
Patient cut his right 4th finger today with glass.  Patient is unsure of last Tdap.

## 2022-05-19 ENCOUNTER — Encounter (HOSPITAL_BASED_OUTPATIENT_CLINIC_OR_DEPARTMENT_OTHER): Payer: Self-pay | Admitting: Emergency Medicine

## 2022-05-19 ENCOUNTER — Emergency Department (HOSPITAL_BASED_OUTPATIENT_CLINIC_OR_DEPARTMENT_OTHER): Payer: 59

## 2022-05-19 ENCOUNTER — Emergency Department (HOSPITAL_BASED_OUTPATIENT_CLINIC_OR_DEPARTMENT_OTHER)
Admission: EM | Admit: 2022-05-19 | Discharge: 2022-05-19 | Disposition: A | Discharge status: Home / Self Care | Payer: 59 | Attending: Emergency Medicine | Admitting: Emergency Medicine

## 2022-05-19 ENCOUNTER — Other Ambulatory Visit: Payer: Self-pay

## 2022-05-19 DIAGNOSIS — R739 Hyperglycemia, unspecified: Secondary | ICD-10-CM

## 2022-05-19 DIAGNOSIS — N390 Urinary tract infection, site not specified: Secondary | ICD-10-CM | POA: Insufficient documentation

## 2022-05-19 DIAGNOSIS — N189 Chronic kidney disease, unspecified: Secondary | ICD-10-CM | POA: Diagnosis not present

## 2022-05-19 DIAGNOSIS — Z79899 Other long term (current) drug therapy: Secondary | ICD-10-CM | POA: Diagnosis not present

## 2022-05-19 DIAGNOSIS — I129 Hypertensive chronic kidney disease with stage 1 through stage 4 chronic kidney disease, or unspecified chronic kidney disease: Secondary | ICD-10-CM | POA: Diagnosis not present

## 2022-05-19 DIAGNOSIS — Z794 Long term (current) use of insulin: Secondary | ICD-10-CM | POA: Diagnosis not present

## 2022-05-19 DIAGNOSIS — E1122 Type 2 diabetes mellitus with diabetic chronic kidney disease: Secondary | ICD-10-CM | POA: Diagnosis not present

## 2022-05-19 DIAGNOSIS — Z7984 Long term (current) use of oral hypoglycemic drugs: Secondary | ICD-10-CM | POA: Diagnosis not present

## 2022-05-19 DIAGNOSIS — E1165 Type 2 diabetes mellitus with hyperglycemia: Secondary | ICD-10-CM | POA: Diagnosis not present

## 2022-05-19 DIAGNOSIS — R109 Unspecified abdominal pain: Secondary | ICD-10-CM

## 2022-05-19 DIAGNOSIS — R1031 Right lower quadrant pain: Secondary | ICD-10-CM | POA: Diagnosis present

## 2022-05-19 HISTORY — DX: Heart failure, unspecified: I50.9

## 2022-05-19 LAB — URINALYSIS, ROUTINE W REFLEX MICROSCOPIC
Bilirubin Urine: NEGATIVE
Glucose, UA: >=500 mg/dL — AB
Ketones, ur: NEGATIVE mg/dL
Leukocytes,Ua: NEGATIVE
Nitrite: NEGATIVE
Protein, ur: 30 mg/dL — AB
Specific Gravity, Urine: 1.02 (ref 1.005–1.030)
pH: 6 (ref 5.0–8.0)

## 2022-05-19 LAB — CBC WITH DIFFERENTIAL/PLATELET
Abs Immature Granulocytes: 0.03 10*3/uL (ref 0.00–0.07)
Basophils Absolute: 0.1 10*3/uL (ref 0.0–0.1)
Basophils Relative: 1 %
Eosinophils Absolute: 0.1 10*3/uL (ref 0.0–0.5)
Eosinophils Relative: 1 %
HCT: 42.3 % (ref 39.0–52.0)
Hemoglobin: 15.1 g/dL (ref 13.0–17.0)
Immature Granulocytes: 0 %
Lymphocytes Relative: 21 %
Lymphs Abs: 2.1 10*3/uL (ref 0.7–4.0)
MCH: 29.3 pg (ref 26.0–34.0)
MCHC: 35.7 g/dL (ref 30.0–36.0)
MCV: 82.1 fL (ref 80.0–100.0)
Monocytes Absolute: 0.4 10*3/uL (ref 0.1–1.0)
Monocytes Relative: 4 %
Neutro Abs: 7.4 10*3/uL (ref 1.7–7.7)
Neutrophils Relative %: 73 %
Platelets: 226 10*3/uL (ref 150–400)
RBC: 5.15 MIL/uL (ref 4.22–5.81)
RDW: 12.3 % (ref 11.5–15.5)
WBC: 10 10*3/uL (ref 4.0–10.5)
nRBC: 0 % (ref 0.0–0.2)

## 2022-05-19 LAB — COMPREHENSIVE METABOLIC PANEL
ALT: 23 U/L (ref 0–44)
AST: 21 U/L (ref 15–41)
Albumin: 3.7 g/dL (ref 3.5–5.0)
Alkaline Phosphatase: 74 U/L (ref 38–126)
Anion gap: 8 (ref 5–15)
BUN: 11 mg/dL (ref 6–20)
CO2: 24 mmol/L (ref 22–32)
Calcium: 8.8 mg/dL — ABNORMAL LOW (ref 8.9–10.3)
Chloride: 99 mmol/L (ref 98–111)
Creatinine, Ser: 0.68 mg/dL (ref 0.61–1.24)
GFR, Estimated: >60 mL/min (ref >60)
Glucose, Bld: 312 mg/dL — ABNORMAL HIGH (ref 70–99)
Potassium: 3.7 mmol/L (ref 3.5–5.1)
Sodium: 131 mmol/L — ABNORMAL LOW (ref 135–145)
Total Bilirubin: 0.7 mg/dL (ref 0.3–1.2)
Total Protein: 7.5 g/dL (ref 6.5–8.1)

## 2022-05-19 LAB — URINALYSIS, MICROSCOPIC (REFLEX): RBC / HPF: >50 RBC/hpf (ref 0–5)

## 2022-05-19 LAB — LIPASE, BLOOD: Lipase: 29 U/L (ref 11–51)

## 2022-05-19 LAB — CBG MONITORING, ED: Glucose-Capillary: 254 mg/dL — ABNORMAL HIGH (ref 70–99)

## 2022-05-19 MED ORDER — IBUPROFEN 600 MG PO TABS: 600.0000 mg | ORAL_TABLET | Freq: Four times a day (QID) | ORAL | 0 refills | Status: AC | PRN

## 2022-05-19 MED ORDER — DOXYCYCLINE HYCLATE 100 MG PO CAPS: 100.0000 mg | ORAL_CAPSULE | Freq: Two times a day (BID) | ORAL | 0 refills | Status: AC

## 2022-05-19 MED ORDER — KETOROLAC TROMETHAMINE 15 MG/ML IJ SOLN: 15.0000 mg | Freq: Once | INTRAMUSCULAR | Status: DC

## 2022-05-19 MED ORDER — ONDANSETRON HCL 4 MG PO TABS: 4.0000 mg | ORAL_TABLET | Freq: Four times a day (QID) | ORAL | 0 refills | Status: AC

## 2022-05-19 MED ORDER — ONDANSETRON HCL 4 MG/2ML IJ SOLN
4.0000 mg | Freq: Once | INTRAMUSCULAR | Status: AC
  Administered 2022-05-19: 4 mg via INTRAVENOUS
  Filled 2022-05-19: qty 2

## 2022-05-19 MED ORDER — DOXYCYCLINE HYCLATE 100 MG PO TABS
100.0000 mg | ORAL_TABLET | Freq: Once | ORAL | Status: AC
  Administered 2022-05-19: 100 mg via ORAL
  Filled 2022-05-19: qty 1

## 2022-05-19 MED ORDER — KETOROLAC TROMETHAMINE 30 MG/ML IJ SOLN
15.0000 mg | Freq: Once | INTRAMUSCULAR | Status: AC
  Administered 2022-05-19: 15 mg via INTRAMUSCULAR
  Filled 2022-05-19: qty 1

## 2022-05-19 MED ORDER — OXYCODONE-ACETAMINOPHEN 5-325 MG PO TABS
1.0000 | ORAL_TABLET | Freq: Once | ORAL | Status: AC
  Administered 2022-05-19: 1 via ORAL
  Filled 2022-05-19: qty 1

## 2022-05-19 MED ORDER — INSULIN ASPART 100 UNIT/ML IJ SOLN
5.0000 [IU] | Freq: Once | INTRAMUSCULAR | Status: AC
  Administered 2022-05-19: 5 [IU] via SUBCUTANEOUS

## 2022-05-19 NOTE — Discharge Instructions (Signed)
You have been evaluated for your symptoms.  Fortunately no evidence of kidney stone causing obstruction at this time.  You may have passed several kidney stone recently which contribute to your symptoms.  Your urine shows signs of infection.  Please take antibiotic as prescribed.  Take antinausea medication as needed.  Your blood sugar is elevated today, make sure to take your diabetes medication appropriately.  Return if you have any concern.  Otherwise follow-up with your urologist.

## 2022-05-19 NOTE — ED Provider Notes (Signed)
[place] [facility] Provider Note   CSN: [id number] Arrival date & time: 05/19/22  1533     History  Chief Complaint  Patient presents with   Flank Pain    Carlos Porter is a 47 y.o. male.  The history is provided by the patient and medical records. No language interpreter was used.  Flank Pain     48 year old male significant history of diabetes, hypertension, polysubstance use, CHF, kidney stones, complaining of right flank pain.  Patient report for the past week he has had recurrent pain to his right flank.  Pain is sharp stabbing moderate to severe felt very similar to prior kidney stone.  He also endorsed urinary discomfort with burning urination and urgency with frequency.  He endorsed nausea without vomiting.  He denies fever or chills no runny nose sneezing or coughing no chest pain or shortness of breath no penile pain.  He tried contacting his urologist but next point is not until 3 weeks from now.  At home he tried laying in a recliner to help with his pain.  Home Medications Prior to Admission medications   Medication Sig Start Date End Date Taking? Authorizing Provider  atorvastatin (LIPITOR) 40 MG tablet Take 40 mg by mouth daily.    [provider]  bacitracin ointment Apply 1 application topically 2 (two) times daily. 08/23/20   Chase Picket, MD  budesonide-formoterol (SYMBICORT) 80-4.5 MCG/ACT inhaler Inhale 2 puffs into the lungs 2 (two) times daily.    [provider]  carvedilol (COREG) 3.125 MG tablet Take 3.125 mg by mouth 2 (two) times daily with a meal.    [provider]  insulin glargine (LANTUS) 100 UNIT/ML injection Inject 60 Units into the skin 2 (two) times daily.    [provider]  levETIRAcetam (KEPPRA) 750 MG tablet  07/16/12   [provider]  lisinopril (PRINIVIL,ZESTRIL) 40 MG tablet Take 40 mg by mouth daily.    [provider]  metFORMIN (GLUCOPHAGE)  500 MG tablet Take 500 mg by mouth 2 (two) times daily with a meal.    [provider]  predniSONE (DELTASONE) 20 MG tablet 2 tabs po daily x 4 days 10/21/12   Evelina Bucy, MD  sertraline (ZOLOFT) 100 MG tablet Take 200 mg by mouth daily.    [provider]  traZODone (DESYREL) 150 MG tablet Take 1 tablet (150 mg total) by mouth at bedtime. 05/10/11 06/09/11  Maryruth Eve, MD  UNIFINE PENTIPS 31G X 8 MM MISC  08/17/12   [provider]  albuterol (PROVENTIL HFA;VENTOLIN HFA) 108 (90 BASE) MCG/ACT inhaler Inhale 2 puffs into the lungs as needed for wheezing. Use as directed.  08/23/20  [provider]  diphenhydrAMINE (BENADRYL) 25 MG tablet Take 2 tablets (50 mg total) by mouth every 4 (four) hours as needed for itching. 10/21/12 08/23/20  Evelina Bucy, MD  omega-3 acid ethyl esters (LOVAZA) 1 G capsule Take 2 g by mouth 2 (two) times daily.  08/23/20  [provider]  ranitidine (ZANTAC) 150 MG tablet Take 1 tablet (150 mg total) by mouth 2 (two) times daily. 10/21/12 08/23/20  Evelina Bucy, MD  topiramate (TOPAMAX) 25 MG tablet Take 2 tablets (50 mg total) by mouth 2 (two) times daily. 05/10/11 06/05/11  Maryruth Eve, MD      Allergies    Acetaminophen, Darvocet [propoxyphene n-acetaminophen], Darvon, Keflex [cephalexin], and Morphine and related    Review of Systems   Review  of Systems  Genitourinary:  Positive for flank pain.  All other systems reviewed and are negative.   Physical Exam Updated Vital Signs BP (!) 144/85   Pulse 85   Temp 98.5 F (36.9 C) (Oral)   Resp 20   Ht '5\' 6"'$  (1.676 m)   Wt 104.8 kg   SpO2 98%   BMI 37.28 kg/m  Physical Exam Vitals and nursing note reviewed.  Constitutional:      General: He is not in acute distress.    Appearance: He is well-developed.     Comments: Patient laying in right lateral decubitus position appears uncomfortable.  HENT:     Head: Atraumatic.  Eyes:     Conjunctiva/sclera: Conjunctivae  normal.  Cardiovascular:     Rate and Rhythm: Normal rate and regular rhythm.     Pulses: Normal pulses.     Heart sounds: Normal heart sounds.  Pulmonary:     Breath sounds: Normal breath sounds. No wheezing, rhonchi or rales.  Abdominal:     Palpations: Abdomen is soft.     Tenderness: There is abdominal tenderness (Suprapubic tenderness no guarding or rebound tenderness.). There is right CVA tenderness. There is no left CVA tenderness.  Musculoskeletal:     Cervical back: Neck supple.  Skin:    Findings: No rash.  Neurological:     Mental Status: He is alert.     ED Results / Procedures / Treatments   Labs (all labs ordered are listed, but only abnormal results are displayed) Labs Reviewed  COMPREHENSIVE METABOLIC PANEL - Abnormal; Notable for the following components:      Result Value   Sodium 131 (*)    Glucose, Bld 312 (*)    Calcium 8.8 (*)    All other components within normal limits  URINALYSIS, ROUTINE W REFLEX MICROSCOPIC - Abnormal; Notable for the following components:   APPearance CLOUDY (*)    Glucose, UA >=500 (*)    Hgb urine dipstick LARGE (*)    Protein, ur 30 (*)    All other components within normal limits  URINALYSIS, MICROSCOPIC (REFLEX) - Abnormal; Notable for the following components:   Bacteria, UA MANY (*)    All other components within normal limits  CBG MONITORING, ED - Abnormal; Notable for the following components:   Glucose-Capillary 254 (*)    All other components within normal limits  CBC WITH DIFFERENTIAL/PLATELET  LIPASE, BLOOD    EKG None  Radiology US SCROTUM W/DOPPLER  Result Date: 05/19/2022 CLINICAL DATA:  Bilateral testicular pain EXAM: SCROTAL ULTRASOUND DOPPLER ULTRASOUND OF THE TESTICLES TECHNIQUE: Complete ultrasound examination of the testicles, epididymis, and other scrotal structures was performed. Color and spectral Doppler ultrasound were also utilized to evaluate blood flow to the testicles. COMPARISON:  CT from  earlier in the same day. FINDINGS: Right testicle Measurements: 5.0 x 2.0 x 3.3 cm. No mass or microlithiasis visualized. Left testicle Measurements: 4.3 x 2.1 x 2.6 cm. No mass or microlithiasis visualized. Right epididymis:  Normal in size and appearance. Left epididymis:  6 mm left epididymal cyst is noted. Hydrocele:  Small bilateral hydroceles are noted. Varicocele:  None visualized. Pulsed Doppler interrogation of both testes demonstrates normal low resistance arterial and venous waveforms bilaterally. IMPRESSION: Small bilateral hydroceles. Small left epididymal cyst. Normal-appearing testicles without evidence of torsion. Electronically Signed   By: Inez Catalina M.D.   On: 05/19/2022 19:55   CT Renal Stone Study  Result Date: 05/19/2022 CLINICAL DATA:  Abdominal/flank pain, stone suspected  Right-sided pain, history of stones. EXAM: CT ABDOMEN AND PELVIS WITHOUT CONTRAST TECHNIQUE: Multidetector CT imaging of the abdomen and pelvis was performed following the standard protocol without IV contrast. RADIATION DOSE REDUCTION: This exam was performed according to the departmental dose-optimization program which includes automated exposure control, adjustment of the mA and/or kV according to patient size and/or use of iterative reconstruction technique. COMPARISON:  Remote CT 01/18/2008 FINDINGS: Lower chest: No focal airspace disease or pleural effusion. Hepatobiliary: The liver is enlarged measuring at least 20 cm cranial caudal with diffuse hepatic steatosis. No focal liver abnormality on this unenhanced exam. Partially distended gallbladder. No calcified gallstone. No pericholecystic inflammation. No biliary dilatation. Pancreas: No ductal dilatation or inflammation. Spleen: Normal in size without focal abnormality. Adrenals/Urinary Tract: Normal adrenal glands. There are 2 punctate nonobstructing stones in the mid lower right kidney. No hydronephrosis or perinephric edema. Decompressed ureters without  ureteral stone. No evidence of focal renal abnormality on this unenhanced exam. The urinary bladder is partially distended, no bladder stone or wall thickening. No urethral stones are seen. Stomach/Bowel: Normal appendix, no appendicitis. No bowel obstruction or inflammatory change. No terminal ileal inflammation. Small volume of stool in the colon. Vascular/Lymphatic: Mild aortic and branch atherosclerosis. No aortic aneurysm. Small periportal nodes are typically reactive. No suspicious abdominopelvic adenopathy. Reproductive: Prominent prostate spans 5.2 cm transverse. Other: No ascites or free air. No abdominal wall hernia. Musculoskeletal: There are no acute or suspicious osseous abnormalities. IMPRESSION: 1. Two punctate nonobstructing stones in the right kidney. No hydronephrosis or obstructive uropathy. 2. Hepatomegaly and hepatic steatosis. 3. Enlarged prostate gland. Aortic Atherosclerosis (ICD10-I70.0). Electronically Signed   By: Keith Rake M.D.   On: 05/19/2022 16:07    Procedures Procedures    Medications Ordered in ED Medications  doxycycline (VIBRA-TABS) tablet 100 mg (100 mg Oral Given 05/19/22 1853)  ketorolac (TORADOL) 30 MG/ML injection 15 mg (15 mg Intramuscular Given 05/19/22 1853)  insulin aspart (novoLOG) injection 5 Units (5 Units Subcutaneous Given 05/19/22 1950)  ondansetron (ZOFRAN) injection 4 mg (4 mg Intravenous Given 05/19/22 1941)  oxyCODONE-acetaminophen (PERCOCET/ROXICET) 5-325 MG per tablet 1 tablet (1 tablet Oral Given 05/19/22 1936)    ED Course/ Medical Decision Making/ A&P                             Medical Decision Making Amount and/or Complexity of Data Reviewed Labs: ordered. Radiology: ordered.  Risk Prescription drug management.   BP (!) 144/85   Pulse 85   Temp 98.5 F (36.9 C) (Oral)   Resp 20   Ht '5\' 6"'$  (1.676 m)   Wt 104.8 kg   SpO2 98%   BMI 37.28 kg/m   35:38 PM  47 year old male significant history of diabetes, hypertension,  polysubstance use, CHF, kidney stones, complaining of right flank pain.  Patient report for the past week he has had recurrent pain to his right flank.  Pain is sharp stabbing moderate to severe felt very similar to prior kidney stone.  He also endorsed urinary discomfort with burning urination and urgency with frequency.  He endorsed nausea without vomiting.  He denies fever or chills no runny nose sneezing or coughing no chest pain or shortness of breath no penile pain.  He tried contacting his urologist but next point is not until 3 weeks from now.  At home he tried laying in a recliner to help with his pain.  On exam, patient is laying in bed  shaky appears uncomfortable.  Heart with normal rate and rhythm, lungs are clear to auscultation bilaterally abdomen soft with tenderness to suprapubic region but no guarding or rebound tenderness.  Patient does have right-sided CVA tenderness no overlying skin changes.  Vital signs reviewed and overall reassuring blood pressure mildly elevated at 144/85.  Patient is afebrile, no hypoxia.  -Labs ordered, independently viewed and interpreted by me.  Labs remarkable for CBG 312, improves to 254 with 5 unit of insulin.  Normal WBC.  UA with finding suggestive of UTI -The patient was maintained on a cardiac monitor.  I personally viewed and interpreted the cardiac monitored which showed an underlying rhythm of: NSR -Imaging independently viewed and interpreted by me and I agree with radiologist's interpretation.  Result remarkable for CT renal stone study showing 2 punctate nonobstructing stones.  Scrotal US without evidence of torsion or concerning changes -This patient presents to the ED for concern of flank pain, this involves an extensive number of treatment options, and is a complaint that carries with it a high risk of complications and morbidity.  The differential diagnosis includes UTI, pyelonephritis, kidney stone, testicular torsion, appendicitis, muscle  strain, hernia -Co morbidities that complicate the patient evaluation includes hx of kidney stone, polysubstance use -Treatment includes toradol, IVF, percocet, antiemetic -Reevaluation of the patient after these medicines showed that the patient improved -PCP office notes or outside notes reviewed -Escalation to admission/observation considered: patients feels much better, is comfortable with discharge, and will follow up with PCP -Prescription medication considered, patient comfortable with zofran, doxy, ibuprofen -Social Determinant of Health considered which includes tobacco use, cessation recommended.   7:07 PM On reassessment, patient still appears to be uncomfortable, actively vomiting.  I was able to his spoke with his wife over the phone and she was concerned that he was having pain to his right testicle as well.  On exam with chaperone present, patient does have tenderness to right testicle without any edema erythema or warmth noted.  Will obtain ultrasound to rule out torsion or epididymitis.  Additional pain medication and antinausea medication given.  Fortunately ultrasound did not show any evidence of epididymitis or torsion.  Patient appears more comfortable after receiving treatment.  He is stable for discharge        Final Clinical Impression(s) / ED Diagnoses Final diagnoses:  Lower urinary tract infectious disease  Right flank pain  Hyperglycemia    Rx / DC Orders ED Discharge Orders          Ordered    doxycycline (VIBRAMYCIN) 100 MG capsule  2 times daily        05/19/22 2025    ibuprofen (ADVIL) 600 MG tablet  Every 6 hours PRN        05/19/22 2025    ondansetron (ZOFRAN) 4 MG tablet  Every 6 hours        05/19/22 2025              Domenic Moras, PA-C 05/19/22 2027    Lennice Sites, DO 05/19/22 2136

## 2022-05-19 NOTE — ED Triage Notes (Signed)
Pt c/o RT flank pain; known kidney stones; has appt w/ urology later this month, but pain is too bad
